# Patient Record
Sex: Female | Born: 1970 | Race: Black or African American | Hispanic: No | Marital: Married | Smoking: Former smoker
Health system: Southern US, Community
[De-identification: ages and names within clinical notes are randomized; demographics above are authoritative.]

## PROBLEM LIST (undated history)

## (undated) ENCOUNTER — Encounter

## (undated) ENCOUNTER — Ambulatory Visit

## (undated) ENCOUNTER — Telehealth

## (undated) ENCOUNTER — Encounter: Attending: Internal Medicine | Primary: Internal Medicine

## (undated) ENCOUNTER — Ambulatory Visit: Attending: Surgery | Primary: Surgery

## (undated) ENCOUNTER — Ambulatory Visit: Payer: PRIVATE HEALTH INSURANCE | Attending: Adult Health | Primary: Adult Health

## (undated) ENCOUNTER — Ambulatory Visit: Payer: PRIVATE HEALTH INSURANCE

## (undated) ENCOUNTER — Encounter: Attending: Surgery | Primary: Surgery

## (undated) ENCOUNTER — Telehealth: Attending: Surgery | Primary: Surgery

## (undated) ENCOUNTER — Ambulatory Visit: Payer: PRIVATE HEALTH INSURANCE | Attending: Internal Medicine | Primary: Internal Medicine

## (undated) DIAGNOSIS — Z1371 Encounter for nonprocreative screening for genetic disease carrier status: Secondary | ICD-10-CM

## (undated) DIAGNOSIS — C801 Malignant (primary) neoplasm, unspecified: Secondary | ICD-10-CM

## (undated) DIAGNOSIS — Z923 Personal history of irradiation: Secondary | ICD-10-CM

## (undated) DIAGNOSIS — D649 Anemia, unspecified: Secondary | ICD-10-CM

## (undated) DIAGNOSIS — C50919 Malignant neoplasm of unspecified site of unspecified female breast: Secondary | ICD-10-CM

## (undated) DIAGNOSIS — E559 Vitamin D deficiency, unspecified: Secondary | ICD-10-CM

## (undated) DIAGNOSIS — K219 Gastro-esophageal reflux disease without esophagitis: Secondary | ICD-10-CM

## (undated) HISTORY — DX: Vitamin D deficiency, unspecified: E55.9

## (undated) HISTORY — DX: Malignant (primary) neoplasm, unspecified: C80.1

## (undated) HISTORY — DX: Anemia, unspecified: D64.9

## (undated) HISTORY — DX: Gastro-esophageal reflux disease without esophagitis: K21.9

## (undated) HISTORY — PX: BREAST BIOPSY: SHX20

## (undated) HISTORY — DX: Encounter for nonprocreative screening for genetic disease carrier status: Z13.71

---
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
## 1998-03-08 ENCOUNTER — Other Ambulatory Visit: Admission: RE | Admit: 1998-03-08 | Discharge: 1998-03-08 | Payer: Self-pay | Admitting: Obstetrics and Gynecology

## 1999-03-11 ENCOUNTER — Other Ambulatory Visit: Admission: RE | Admit: 1999-03-11 | Discharge: 1999-03-11 | Payer: Self-pay | Admitting: Obstetrics and Gynecology

## 1999-10-04 ENCOUNTER — Encounter: Admission: RE | Admit: 1999-10-04 | Discharge: 1999-10-04 | Payer: Self-pay | Admitting: Obstetrics and Gynecology

## 1999-10-04 ENCOUNTER — Encounter: Payer: Self-pay | Admitting: Obstetrics and Gynecology

## 2000-07-20 ENCOUNTER — Other Ambulatory Visit: Admission: RE | Admit: 2000-07-20 | Discharge: 2000-07-20 | Payer: Self-pay | Admitting: Obstetrics and Gynecology

## 2001-05-14 ENCOUNTER — Encounter: Payer: Self-pay | Admitting: Obstetrics and Gynecology

## 2001-05-14 ENCOUNTER — Encounter: Admission: RE | Admit: 2001-05-14 | Discharge: 2001-05-14 | Payer: Self-pay | Admitting: Obstetrics and Gynecology

## 2001-07-26 ENCOUNTER — Other Ambulatory Visit: Admission: RE | Admit: 2001-07-26 | Discharge: 2001-07-26 | Payer: Self-pay | Admitting: Obstetrics and Gynecology

## 2002-08-29 ENCOUNTER — Other Ambulatory Visit: Admission: RE | Admit: 2002-08-29 | Discharge: 2002-08-29 | Payer: Self-pay | Admitting: Obstetrics and Gynecology

## 2003-09-28 ENCOUNTER — Other Ambulatory Visit: Admission: RE | Admit: 2003-09-28 | Discharge: 2003-09-28 | Payer: Self-pay | Admitting: Obstetrics and Gynecology

## 2004-06-05 HISTORY — PX: DILATION AND CURETTAGE OF UTERUS: SHX78

## 2004-07-21 ENCOUNTER — Emergency Department: Payer: Self-pay | Admitting: Emergency Medicine

## 2006-03-01 ENCOUNTER — Ambulatory Visit: Payer: Self-pay | Admitting: Family Medicine

## 2006-03-07 ENCOUNTER — Ambulatory Visit: Payer: Self-pay | Admitting: Obstetrics and Gynecology

## 2012-06-05 HISTORY — PX: HAND SURGERY: SHX662

## 2013-04-17 LAB — HM MAMMOGRAPHY

## 2013-04-30 ENCOUNTER — Ambulatory Visit: Payer: Self-pay | Admitting: Specialist

## 2013-05-07 ENCOUNTER — Ambulatory Visit: Payer: Self-pay | Admitting: Nurse Practitioner

## 2013-05-13 ENCOUNTER — Emergency Department: Payer: Self-pay | Admitting: Emergency Medicine

## 2013-05-14 ENCOUNTER — Ambulatory Visit: Payer: Self-pay | Admitting: Nurse Practitioner

## 2013-05-14 HISTORY — PX: BREAST SURGERY: SHX581

## 2013-06-05 DIAGNOSIS — C50919 Malignant neoplasm of unspecified site of unspecified female breast: Secondary | ICD-10-CM

## 2013-06-05 DIAGNOSIS — Z923 Personal history of irradiation: Secondary | ICD-10-CM

## 2013-06-05 DIAGNOSIS — C801 Malignant (primary) neoplasm, unspecified: Secondary | ICD-10-CM

## 2013-06-05 HISTORY — DX: Malignant (primary) neoplasm, unspecified: C80.1

## 2013-06-05 HISTORY — PX: BREAST BIOPSY: SHX20

## 2013-06-05 HISTORY — PX: BREAST LUMPECTOMY: SHX2

## 2013-06-05 HISTORY — DX: Malignant neoplasm of unspecified site of unspecified female breast: C50.919

## 2013-06-05 HISTORY — DX: Personal history of irradiation: Z92.3

## 2013-06-12 ENCOUNTER — Ambulatory Visit: Payer: BC Managed Care – PPO

## 2013-06-12 ENCOUNTER — Encounter: Payer: Self-pay | Admitting: General Surgery

## 2013-06-12 ENCOUNTER — Ambulatory Visit (INDEPENDENT_AMBULATORY_CARE_PROVIDER_SITE_OTHER): Payer: BC Managed Care – PPO | Admitting: General Surgery

## 2013-06-12 VITALS — BP 140/80 | HR 74 | Resp 12 | Ht 69.0 in | Wt 177.0 lb

## 2013-06-12 DIAGNOSIS — N6099 Unspecified benign mammary dysplasia of unspecified breast: Secondary | ICD-10-CM

## 2013-06-12 DIAGNOSIS — N63 Unspecified lump in unspecified breast: Secondary | ICD-10-CM

## 2013-06-12 DIAGNOSIS — N6089 Other benign mammary dysplasias of unspecified breast: Secondary | ICD-10-CM

## 2013-06-12 NOTE — Progress Notes (Signed)
Patient ID: Traci Clements, female   DOB: Jan 28, 1971, 43 y.o.   MRN: [id number]  Chief Complaint  Patient presents with  . Breast Problem    HPI Traci Clements is a 43 y.o. female.  who presents for a breast evaluation. The most recent mammogram was done on 05/07/13. She had a right breast biopsy done a week later which came back benign.  Patient does perform regular self breast checks and gets regular mammograms done.    The patient had noticed thickening in the medial aspect of the right breast, new from a prior mammogram on 04/09/2013. She underwent repeat imaging and subsequent biopsy.    HPI  No past medical history on file.  Past Surgical History  Procedure Laterality Date  . Cesarean section  1998  . Tonsillectomy  1990  . Hand surgery Left 2014  . Dilation and curettage of uterus  2006  . Breast surgery Right 05/14/2013    Stereo biopsy, [facility]    Family History  Problem Relation Age of Onset  . Breast cancer Maternal Grandmother     great     Social History History  Substance Use Topics  . Smoking status: Current Every Day Smoker -- 0.25 packs/day for 15 years  . Smokeless tobacco: Never Used  . Alcohol Use: Yes    Allergies  Allergen Reactions  . Codeine Nausea Only    Current Outpatient Prescriptions  Medication Sig Dispense Refill  . Norethin Ace-Eth Estrad-FE (LOESTRIN FE 1/20 PO) Take 1 tablet by mouth daily.       No current facility-administered medications for this visit.    Review of Systems Review of Systems  Blood pressure 140/80, pulse 74, resp. rate 12, height 5\' 9"  (1.753 m), weight 177 lb (80.287 kg), last menstrual period 03/12/2013.  Physical Exam Physical Exam  Constitutional: She is oriented to person, place, and time. She appears well-developed and well-nourished.  Eyes: Conjunctivae are normal. No scleral icterus.  Neck: Neck supple.  Cardiovascular: Normal rate, regular rhythm and normal heart sounds.    Pulmonary/Chest: Effort normal and breath sounds normal. Right breast exhibits no inverted nipple, no nipple discharge, no skin change and no tenderness. Left breast exhibits no inverted nipple, no mass, no nipple discharge, no skin change and no tenderness.  Lymphadenopathy:    She has no cervical adenopathy.    She has no axillary adenopathy.  Neurological: She is alert and oriented to person, place, and time.  Skin: Skin is warm and dry.    Data Reviewed Screening mammograms completed at [facility] 04/09/2013 as a baseline study showed benign-appearing calcifications bilaterally. Microcalcifications associated with an asymmetric tissue in the right medial breast was identified for which additional views were recommended. BI-RAD-0.  Right breast focal spot compression views and ultrasound dated 05/07/2013 showed a large area of asymmetry/mass effect in the medial breast. Punctate calcifications are identified. Physical examination was reported as negative.Marland Kitchen Ultrasound showed multiple round hypoechoic masses with surrounding hyperechoic tissue measuring 1.2 x 2.3 x 2.4 cm suggestive of multiple cysts.  Core biopsy was completed 05/14/2013 by the radiology service.  Ultrasound biopsy report dated 05/14/2013 reported to, 1 mm intraductal papillomas, one of which showed sclerosis. Atypical ductal hyperplasia in one foci, atypical lobular hyperplasia and at least 2 foci, columnar cell changes and a few scattered microcyst. The pathologic findings were not felt to correlate with report of a mass lesion.  Ultrasound examination of the right breast in the 2:00 position, 4 cm from  the nipple showed a 1.1 x 1.2 x 1.36 cm heterogeneous mass with acoustic shadowing consistent with her recent biopsy site.  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    Plan    Indication for formal excision of the area of mammographic/ultrasound abnormality to help determine if  additional pathologic processes are present (upstaging to DCIS/invasive cancer). The patient was amenable to proceed. This has been scheduled for 07/03/2013 as an outpatient at [facility].       Traci Clements 06/13/2013, 2:40 PM

## 2013-06-12 NOTE — Patient Instructions (Signed)
Patient to have a right breast biopsy at [facility].

## 2013-06-13 ENCOUNTER — Other Ambulatory Visit: Payer: Self-pay | Admitting: General Surgery

## 2013-06-13 ENCOUNTER — Encounter: Payer: Self-pay | Admitting: General Surgery

## 2013-06-13 DIAGNOSIS — N6099 Unspecified benign mammary dysplasia of unspecified breast: Secondary | ICD-10-CM | POA: Insufficient documentation

## 2013-07-03 ENCOUNTER — Ambulatory Visit: Payer: Self-pay | Admitting: General Surgery

## 2013-07-03 DIAGNOSIS — D0590 Unspecified type of carcinoma in situ of unspecified breast: Secondary | ICD-10-CM

## 2013-07-03 DIAGNOSIS — D0510 Intraductal carcinoma in situ of unspecified breast: Secondary | ICD-10-CM | POA: Insufficient documentation

## 2013-07-03 HISTORY — PX: BREAST SURGERY: SHX581

## 2013-07-04 ENCOUNTER — Encounter: Payer: Self-pay | Admitting: General Surgery

## 2013-07-07 ENCOUNTER — Encounter: Payer: Self-pay | Admitting: General Surgery

## 2013-07-07 ENCOUNTER — Telehealth: Payer: Self-pay | Admitting: General Surgery

## 2013-07-07 NOTE — Telephone Encounter (Signed)
Appointment time has been changed accordingly.   Traci Clements- make sure we have hard copy of pathology report. Also, chart will need to be moved in your stack according to appointment time. Ask if questions. Thanks.

## 2013-07-07 NOTE — Telephone Encounter (Signed)
The patient was notified that her breast biopsy completed on 07/03/2013 did show some very early cancer, DCIS/LCIS. Margins were negative.  We'll plan to get together on Thursday, February 5 at 4:30 PM to review options for additional treatment.

## 2013-07-09 ENCOUNTER — Encounter: Payer: Self-pay | Admitting: General Surgery

## 2013-07-09 LAB — PATHOLOGY REPORT

## 2013-07-10 ENCOUNTER — Ambulatory Visit (INDEPENDENT_AMBULATORY_CARE_PROVIDER_SITE_OTHER): Payer: BC Managed Care – PPO | Admitting: General Surgery

## 2013-07-10 ENCOUNTER — Encounter: Payer: Self-pay | Admitting: General Surgery

## 2013-07-10 VITALS — BP 134/84 | HR 76 | Resp 12 | Ht 69.0 in | Wt 176.0 lb

## 2013-07-10 DIAGNOSIS — D0590 Unspecified type of carcinoma in situ of unspecified breast: Secondary | ICD-10-CM

## 2013-07-10 DIAGNOSIS — D0510 Intraductal carcinoma in situ of unspecified breast: Secondary | ICD-10-CM

## 2013-07-10 NOTE — Patient Instructions (Signed)
Continue self breast exams. Call office for any new breast issues or concerns. 

## 2013-07-10 NOTE — Progress Notes (Signed)
Patient ID: Traci Clements, female   DOB: [birth year], 43 y.o.   MRN: [id number]  Chief Complaint  Patient presents with  . Routine Post Op    right breast biopsy    HPI Traci Clements is a 43 y.o. female here today for her post op right breast wide excision done 07/03/13. Patient states she is doing well. Denies pain only a little soreness. HPI  No past medical history on file.  Past Surgical History  Procedure Laterality Date  . Cesarean section  1998  . Tonsillectomy  1990  . Hand surgery Left 2014  . Dilation and curettage of uterus  2006  . Breast surgery Right 05/14/2013    Stereo biopsy, [facility]  . Breast surgery Right 07-03-13    DCIS    Family History  Problem Relation Age of Onset  . Breast cancer Maternal Grandmother     great     Social History History  Substance Use Topics  . Smoking status: Current Every Day Smoker -- 0.25 packs/day for 15 years  . Smokeless tobacco: Never Used  . Alcohol Use: Yes    Allergies  Allergen Reactions  . Codeine Nausea Only    Current Outpatient Prescriptions  Medication Sig Dispense Refill  . Norethin Ace-Eth Estrad-FE (LOESTRIN FE 1/20 PO) Take 1 tablet by mouth daily.       No current facility-administered medications for this visit.    Review of Systems Review of Systems  Constitutional: Negative.   Respiratory: Negative.   Cardiovascular: Negative.     Blood pressure 134/84, pulse 76, resp. rate 12, height 5\' 9"  (1.753 m), weight 176 lb (79.833 kg), last menstrual period 06/30/2013.  Physical Exam Physical Exam  Constitutional: She is oriented to person, place, and time. She appears well-developed and well-nourished.  Neurological: She is alert and oriented to person, place, and time.  Skin: Skin is warm and dry.  The wide excision site at the upper inner quadrant of the right breast is healing well without evidence of hematoma formation. Mild local skin edema inferior to the incision secondary to  reconstruction techniques.  Data Reviewed Right breast wide excision completed July 03, 2013 showed DCIS, LCIS, fibrocystic changes, duct ectasia, columnar cell changes with hyperplasia, intraductal papilloma, changes related to the previous biopsy site. pTis. ER/PR 90%.  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     Plan    The patient was accompanied today by her husband, daughter and mother.  She is encouraged to consider genetic testing due to her young age and diagnosis.  Indication for post wide excision radiation therapy was reviewed.  Indication for post treatment antiestrogen therapy was discussed. An opportunity will be made for the patient to be evaluated for medical oncology assessment for any possible treatment protocols, and if not antiestrogen therapy will be prescribed to this office.  The need to look for and on hormonal methods of birth control was discussed, and this will take place after evaluation by radiation and medical oncology.          Robert Bellow 07/10/2013, 8:30 PM

## 2013-07-10 NOTE — Addendum Note (Signed)
Addended by: Robert Bellow on: 07/10/2013 08:34 PM   Modules accepted: Level of Service

## 2013-07-14 ENCOUNTER — Encounter: Payer: Self-pay | Admitting: General Surgery

## 2013-07-15 ENCOUNTER — Ambulatory Visit: Payer: Self-pay | Admitting: Oncology

## 2013-07-15 DIAGNOSIS — Z1371 Encounter for nonprocreative screening for genetic disease carrier status: Secondary | ICD-10-CM

## 2013-07-15 HISTORY — DX: Encounter for nonprocreative screening for genetic disease carrier status: Z13.71

## 2013-08-03 ENCOUNTER — Ambulatory Visit: Payer: Self-pay | Admitting: Oncology

## 2013-08-07 LAB — CBC CANCER CENTER
BASOS ABS: 0 x10 3/mm (ref 0.0–0.1)
BASOS PCT: 0.4 %
EOS PCT: 3.8 %
Eosinophil #: 0.2 x10 3/mm (ref 0.0–0.7)
HCT: 37.5 % (ref 35.0–47.0)
HGB: 12.7 g/dL (ref 12.0–16.0)
LYMPHS ABS: 1.9 x10 3/mm (ref 1.0–3.6)
Lymphocyte %: 35.4 %
MCH: 33.1 pg (ref 26.0–34.0)
MCHC: 33.8 g/dL (ref 32.0–36.0)
MCV: 98 fL (ref 80–100)
MONO ABS: 0.5 x10 3/mm (ref 0.2–0.9)
MONOS PCT: 8.8 %
Neutrophil #: 2.8 x10 3/mm (ref 1.4–6.5)
Neutrophil %: 51.6 %
Platelet: 275 x10 3/mm (ref 150–440)
RBC: 3.83 10*6/uL (ref 3.80–5.20)
RDW: 14.8 % — ABNORMAL HIGH (ref 11.5–14.5)
WBC: 5.5 x10 3/mm (ref 3.6–11.0)

## 2013-08-11 ENCOUNTER — Ambulatory Visit: Payer: BC Managed Care – PPO | Admitting: General Surgery

## 2013-08-13 ENCOUNTER — Ambulatory Visit (INDEPENDENT_AMBULATORY_CARE_PROVIDER_SITE_OTHER): Payer: BC Managed Care – PPO | Admitting: General Surgery

## 2013-08-13 ENCOUNTER — Encounter: Payer: Self-pay | Admitting: General Surgery

## 2013-08-13 VITALS — BP 124/80 | HR 78 | Resp 12 | Ht 68.5 in | Wt 176.0 lb

## 2013-08-13 DIAGNOSIS — D0590 Unspecified type of carcinoma in situ of unspecified breast: Secondary | ICD-10-CM

## 2013-08-13 DIAGNOSIS — D0510 Intraductal carcinoma in situ of unspecified breast: Secondary | ICD-10-CM

## 2013-08-13 NOTE — Patient Instructions (Signed)
Continue self breast exams. Call office for any new breast issues or concerns. 

## 2013-08-13 NOTE — Progress Notes (Signed)
APatient ID: Traci Clements, female   DOB: 05/19/1971, 43 y.o.   MRN: [id number]  Chief Complaint  Patient presents with  . Follow-up    HPI Traci Clements is a 43 y.o. female.  Here today for follow up right breast DCIS. Currently under the care of Dr. Baruch Gouty at [facility] and undergoing radiation. Right breast is a little tender occasionally and she just had radiation this morning. BRCA negative. ( Comprehensive genetic testing was completed to the cancer center).  HPI  Past Medical History  Diagnosis Date  . 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 Cesarean section  1998  . Tonsillectomy  1990  . Hand surgery Left 2014  . Dilation and curettage of uterus  2006  . Breast surgery Right 05/14/2013    Stereo biopsy, [facility]  . Breast surgery Right 07-03-13    DCIS    Family History  Problem Relation Age of Onset  . Breast cancer Maternal Grandmother     great     Social History History  Substance Use Topics  . Smoking status: Current Every Day Smoker -- 0.25 packs/day for 15 years  . Smokeless tobacco: Never Used  . Alcohol Use: Yes    Allergies  Allergen Reactions  . Codeine Nausea Only    Current Outpatient Prescriptions  Medication Sig Dispense Refill  . Multiple Vitamin (MULTIVITAMIN) capsule Take 1 capsule by mouth daily.      . Norethin Ace-Eth Estrad-FE (LOESTRIN FE 1/20 PO) Take 1 tablet by mouth daily.       No current facility-administered medications for this visit.    Review of Systems Review of Systems  Constitutional: Negative.   Respiratory: Negative.   Cardiovascular: Negative.     Blood pressure 124/80, pulse 78, resp. rate 12, height 5' 8.5" (1.74 m), weight 176 lb (79.833 kg), last menstrual period 08/03/2013.  Physical Exam Physical Exam  Constitutional: She is oriented to person, place, and time. She appears well-developed and well-nourished.  Neck:  Neck supple.  Pulmonary/Chest:  Well healed scar right breast with slight thickening  Lymphadenopathy:    She has no cervical adenopathy.  Neurological: She is alert and oriented to person, place, and time.  Skin: Skin is warm and dry.       Assessment    Doing well status post wide excision and tolerating radiation therapy well.     Plan    The patient had been seen by medical oncology. In light of the findings of DCIS in the negative genetic screening she will only be a candidate for tamoxifen radiation is completed.  The patient was advised that she can continue to see both the radiation oncologist, the medical oncologist and myself or that the tamoxifen can be provided to this office.  Side effects from tamoxifen were reviewed including those related to vasomotor symptoms and DVT/PE.   The patient is presently making use of oral contraceptives. Alternate forms of contraception will be to be discussed prior to initiating antiestrogen therapy.  Arrangements will made for a right breast diagnostic mammogram in July 2015 at [place].       Robert Bellow 08/16/2013, 8:34 PM

## 2013-08-14 ENCOUNTER — Encounter: Payer: Self-pay | Admitting: General Surgery

## 2013-08-14 LAB — CBC CANCER CENTER
BASOS ABS: 0.1 x10 3/mm (ref 0.0–0.1)
BASOS PCT: 1.3 %
EOS ABS: 0.3 x10 3/mm (ref 0.0–0.7)
EOS PCT: 4.4 %
HCT: 38.7 % (ref 35.0–47.0)
HGB: 13 g/dL (ref 12.0–16.0)
LYMPHS ABS: 1.9 x10 3/mm (ref 1.0–3.6)
Lymphocyte %: 31 %
MCH: 33 pg (ref 26.0–34.0)
MCHC: 33.5 g/dL (ref 32.0–36.0)
MCV: 98 fL (ref 80–100)
Monocyte #: 0.5 x10 3/mm (ref 0.2–0.9)
Monocyte %: 8 %
NEUTROS PCT: 55.3 %
Neutrophil #: 3.3 x10 3/mm (ref 1.4–6.5)
PLATELETS: 282 x10 3/mm (ref 150–440)
RBC: 3.93 10*6/uL (ref 3.80–5.20)
RDW: 14.1 % (ref 11.5–14.5)
WBC: 6 x10 3/mm (ref 3.6–11.0)

## 2013-08-21 LAB — CBC CANCER CENTER
Basophil #: 0.1 x10 3/mm (ref 0.0–0.1)
Basophil %: 1.2 %
EOS ABS: 0.2 x10 3/mm (ref 0.0–0.7)
Eosinophil %: 4 %
HCT: 38.8 % (ref 35.0–47.0)
HGB: 12.8 g/dL (ref 12.0–16.0)
Lymphocyte #: 1.7 x10 3/mm (ref 1.0–3.6)
Lymphocyte %: 27.6 %
MCH: 32.5 pg (ref 26.0–34.0)
MCHC: 33 g/dL (ref 32.0–36.0)
MCV: 99 fL (ref 80–100)
MONOS PCT: 8 %
Monocyte #: 0.5 x10 3/mm (ref 0.2–0.9)
NEUTROS ABS: 3.7 x10 3/mm (ref 1.4–6.5)
Neutrophil %: 59.2 %
Platelet: 287 x10 3/mm (ref 150–440)
RBC: 3.93 10*6/uL (ref 3.80–5.20)
RDW: 14.2 % (ref 11.5–14.5)
WBC: 6.3 x10 3/mm (ref 3.6–11.0)

## 2013-08-28 LAB — CBC CANCER CENTER
BASOS ABS: 0.1 x10 3/mm (ref 0.0–0.1)
Basophil %: 1.3 %
EOS ABS: 0.3 x10 3/mm (ref 0.0–0.7)
EOS PCT: 3.9 %
HCT: 37.5 % (ref 35.0–47.0)
HGB: 12.4 g/dL (ref 12.0–16.0)
LYMPHS ABS: 1.6 x10 3/mm (ref 1.0–3.6)
Lymphocyte %: 21.3 %
MCH: 32.6 pg (ref 26.0–34.0)
MCHC: 33.1 g/dL (ref 32.0–36.0)
MCV: 98 fL (ref 80–100)
MONOS PCT: 6.7 %
Monocyte #: 0.5 x10 3/mm (ref 0.2–0.9)
NEUTROS ABS: 5 x10 3/mm (ref 1.4–6.5)
Neutrophil %: 66.8 %
Platelet: 257 x10 3/mm (ref 150–440)
RBC: 3.81 10*6/uL (ref 3.80–5.20)
RDW: 14.3 % (ref 11.5–14.5)
WBC: 7.4 x10 3/mm (ref 3.6–11.0)

## 2013-09-03 ENCOUNTER — Ambulatory Visit: Payer: Self-pay | Admitting: Oncology

## 2013-09-04 LAB — CBC CANCER CENTER
Basophil #: 0.1 x10 3/mm (ref 0.0–0.1)
Basophil %: 1.1 %
EOS ABS: 0.3 x10 3/mm (ref 0.0–0.7)
EOS PCT: 4.9 %
HCT: 38.4 % (ref 35.0–47.0)
HGB: 12.8 g/dL (ref 12.0–16.0)
LYMPHS ABS: 1.6 x10 3/mm (ref 1.0–3.6)
Lymphocyte %: 24.6 %
MCH: 32.9 pg (ref 26.0–34.0)
MCHC: 33.4 g/dL (ref 32.0–36.0)
MCV: 99 fL (ref 80–100)
Monocyte #: 0.5 x10 3/mm (ref 0.2–0.9)
Monocyte %: 8.6 %
Neutrophil #: 3.8 x10 3/mm (ref 1.4–6.5)
Neutrophil %: 60.8 %
PLATELETS: 267 x10 3/mm (ref 150–440)
RBC: 3.89 10*6/uL (ref 3.80–5.20)
RDW: 14.3 % (ref 11.5–14.5)
WBC: 6.3 x10 3/mm (ref 3.6–11.0)

## 2013-09-11 LAB — CBC CANCER CENTER
BASOS ABS: 0.1 x10 3/mm (ref 0.0–0.1)
BASOS PCT: 1.2 %
EOS ABS: 0.3 x10 3/mm (ref 0.0–0.7)
EOS PCT: 6.4 %
HCT: 37.5 % (ref 35.0–47.0)
HGB: 12.4 g/dL (ref 12.0–16.0)
LYMPHS ABS: 1.4 x10 3/mm (ref 1.0–3.6)
Lymphocyte %: 27.8 %
MCH: 32.5 pg (ref 26.0–34.0)
MCHC: 33.1 g/dL (ref 32.0–36.0)
MCV: 98 fL (ref 80–100)
MONOS PCT: 10 %
Monocyte #: 0.5 x10 3/mm (ref 0.2–0.9)
Neutrophil #: 2.8 x10 3/mm (ref 1.4–6.5)
Neutrophil %: 54.6 %
Platelet: 258 x10 3/mm (ref 150–440)
RBC: 3.82 10*6/uL (ref 3.80–5.20)
RDW: 14.2 % (ref 11.5–14.5)
WBC: 5.1 x10 3/mm (ref 3.6–11.0)

## 2013-09-24 ENCOUNTER — Encounter: Payer: Self-pay | Admitting: General Surgery

## 2013-09-24 ENCOUNTER — Ambulatory Visit (INDEPENDENT_AMBULATORY_CARE_PROVIDER_SITE_OTHER): Payer: BC Managed Care – PPO | Admitting: General Surgery

## 2013-09-24 VITALS — BP 120/88 | HR 76 | Resp 14 | Ht 68.5 in | Wt 176.0 lb

## 2013-09-24 DIAGNOSIS — D0500 Lobular carcinoma in situ of unspecified breast: Secondary | ICD-10-CM

## 2013-09-24 DIAGNOSIS — N6099 Unspecified benign mammary dysplasia of unspecified breast: Secondary | ICD-10-CM

## 2013-09-24 DIAGNOSIS — D0510 Intraductal carcinoma in situ of unspecified breast: Secondary | ICD-10-CM

## 2013-09-24 DIAGNOSIS — D0590 Unspecified type of carcinoma in situ of unspecified breast: Secondary | ICD-10-CM

## 2013-09-24 DIAGNOSIS — N6089 Other benign mammary dysplasias of unspecified breast: Secondary | ICD-10-CM

## 2013-09-24 DIAGNOSIS — N62 Hypertrophy of breast: Secondary | ICD-10-CM

## 2013-09-24 MED ORDER — TAMOXIFEN CITRATE 20 MG PO TABS: 20.0000 mg | ORAL_TABLET | Freq: Every day | ORAL | Status: DC

## 2013-09-24 NOTE — Progress Notes (Signed)
Patient ID: Traci Clements, female   DOB: 04-Apr-1971, 43 y.o.   MRN: [id number]  Chief Complaint  Patient presents with  . Follow-up    6 week follow up dcis no mammogram    HPI Traci Clements is a 43 y.o. female who presents for a 6 week follow up of DCIS. She underwent a right breast wide excision on 07/03/13. She denies any new problems at this time. She completed her radiation treatments within the last 2 weeks. Most prominent of the skin changes with desquamation have resolved.  HPI  Past Medical History  Diagnosis Date  . 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 Cesarean section  1998  . Tonsillectomy  1990  . Hand surgery Left 2014  . Dilation and curettage of uterus  2006  . Breast surgery Right 05/14/2013    Stereo biopsy, [facility]  . Breast surgery Right 07-03-13    DCIS    Family History  Problem Relation Age of Onset  . Breast cancer Maternal Grandmother     great     Social History History  Substance Use Topics  . Smoking status: Current Every Day Smoker -- 0.25 packs/day for 15 years  . Smokeless tobacco: Never Used  . Alcohol Use: Yes    Allergies  Allergen Reactions  . Codeine Nausea Only    Current Outpatient Prescriptions  Medication Sig Dispense Refill  . Multiple Vitamin (MULTIVITAMIN) capsule Take 1 capsule by mouth daily.      . tamoxifen (NOLVADEX) 20 MG tablet Take 1 tablet (20 mg total) by mouth daily.  30 tablet  11   No current facility-administered medications for this visit.    Review of Systems Review of Systems  Constitutional: Negative.   Respiratory: Negative.   Cardiovascular: Negative.     Blood pressure 120/88, pulse 76, resp. rate 14, height 5' 8.5" (1.74 m), weight 176 lb (79.833 kg), last menstrual period 09/24/2013.  Physical Exam Physical Exam  Constitutional: She is oriented to person,  place, and time. She appears well-developed and well-nourished.  Neck: Neck supple. No thyromegaly present.  Pulmonary/Chest: Right breast exhibits no inverted nipple, no mass, no nipple discharge, no skin change and no tenderness.  Skin changes on the right breast due to radiation treatments.   Lymphadenopathy:    She has no cervical adenopathy.    She has no axillary adenopathy.  Neurological: She is alert and oriented to person, place, and time.  Skin: Skin is warm and dry.      Assessment    DCIS right breast.     Plan    Indication for antiestrogen therapy was reviewed. The patient is amenable to a trial of tamoxifen, 20 mg daily. The potential for DVT was reviewed. Vasomotor symptoms were discussed. She is actively menstruating so the risk of endometrial cancer is exceptionally low. The importance of making use of the medication for one month trial was emphasized.  She will call in one month with a progress report.  Will arrange for a repeat right breast mammogram in 5 months to establish a new baseline.     PCP: No Pcp Per Patient Ref MD: Traci Ferrari NP   Traci Clements 09/25/2013, 8:14 PM

## 2013-09-24 NOTE — Patient Instructions (Signed)
Patient to return in 5 months with right diagnostic mammogram. Patient advised to call in 1 month for an update on how Tamoxifen is doing. Continue self breast exams. Call office for any new breast issues or concerns.

## 2013-09-25 ENCOUNTER — Encounter: Payer: Self-pay | Admitting: General Surgery

## 2013-09-25 DIAGNOSIS — N6099 Unspecified benign mammary dysplasia of unspecified breast: Secondary | ICD-10-CM | POA: Insufficient documentation

## 2013-10-03 ENCOUNTER — Ambulatory Visit: Payer: Self-pay | Admitting: Oncology

## 2013-10-31 ENCOUNTER — Telehealth: Payer: Self-pay | Admitting: General Surgery

## 2013-10-31 NOTE — Telephone Encounter (Signed)
PATIENT CALLED TODAY TO REPORT HOW SHE HAS DONE OVER THE LAST MONTH TAKING TAMOXIFEN.SHE STATES SHE IS DOING WELL AND NOT EXPERIENCE SIDE EFFECTS.

## 2013-11-07 ENCOUNTER — Emergency Department: Payer: Self-pay | Admitting: Emergency Medicine

## 2013-11-07 LAB — COMPREHENSIVE METABOLIC PANEL
ALT: 10 U/L — AB (ref 12–78)
Albumin: 3.4 g/dL (ref 3.4–5.0)
Alkaline Phosphatase: 54 U/L
Anion Gap: 1 — ABNORMAL LOW (ref 7–16)
BUN: 11 mg/dL (ref 7–18)
Bilirubin,Total: 0.2 mg/dL (ref 0.2–1.0)
CHLORIDE: 113 mmol/L — AB (ref 98–107)
CO2: 27 mmol/L (ref 21–32)
CREATININE: 0.92 mg/dL (ref 0.60–1.30)
Calcium, Total: 8.5 mg/dL (ref 8.5–10.1)
Glucose: 85 mg/dL (ref 65–99)
Osmolality: 280 (ref 275–301)
Potassium: 3.8 mmol/L (ref 3.5–5.1)
SGOT(AST): 13 U/L — ABNORMAL LOW (ref 15–37)
SODIUM: 141 mmol/L (ref 136–145)
Total Protein: 6.8 g/dL (ref 6.4–8.2)

## 2013-11-07 LAB — TROPONIN I: Troponin-I: <0.02 ng/mL

## 2013-11-07 LAB — URINALYSIS, COMPLETE
BILIRUBIN, UR: NEGATIVE
Bacteria: NONE SEEN
GLUCOSE, UR: NEGATIVE mg/dL (ref 0–75)
KETONE: NEGATIVE
Leukocyte Esterase: NEGATIVE
NITRITE: NEGATIVE
PH: 6 (ref 4.5–8.0)
PROTEIN: NEGATIVE
RBC,UR: 27 /HPF (ref 0–5)
Specific Gravity: 1.032 (ref 1.003–1.030)
Squamous Epithelial: 2

## 2013-11-07 LAB — CBC
HCT: 37.7 % (ref 35.0–47.0)
HGB: 12.7 g/dL (ref 12.0–16.0)
MCH: 33.4 pg (ref 26.0–34.0)
MCHC: 33.7 g/dL (ref 32.0–36.0)
MCV: 99 fL (ref 80–100)
Platelet: 259 10*3/uL (ref 150–440)
RBC: 3.8 10*6/uL (ref 3.80–5.20)
RDW: 14.1 % (ref 11.5–14.5)
WBC: 6.2 10*3/uL (ref 3.6–11.0)

## 2013-11-07 LAB — LIPASE, BLOOD: Lipase: 267 U/L (ref 73–393)

## 2014-02-25 ENCOUNTER — Ambulatory Visit: Payer: BC Managed Care – PPO | Admitting: General Surgery

## 2014-03-11 ENCOUNTER — Encounter: Payer: Self-pay | Admitting: *Deleted

## 2014-03-19 ENCOUNTER — Ambulatory Visit: Payer: Self-pay | Admitting: Oncology

## 2014-04-05 ENCOUNTER — Ambulatory Visit: Payer: Self-pay | Admitting: Oncology

## 2014-04-06 ENCOUNTER — Encounter: Payer: Self-pay | Admitting: General Surgery

## 2014-09-25 NOTE — Op Note (Signed)
PATIENT NAME:  AROHI, SALVATIERRA MR#:  [id number] DATE OF BIRTH:  1971/05/09  DATE OF PROCEDURE:  04/30/2013  PREOPERATIVE DIAGNOSIS: Dorsal left wrist ganglion.   POSTOPERATIVE DIAGNOSIS: Dorsal left wrist ganglion.   PROCEDURE: Excision of dorsal left wrist ganglion.   SURGEON: Lucas Mallow, MD  ANESTHESIA: General.   Complications: None  PROCEDURE: General anesthesia is induced. The left upper extremity is thoroughly prepped with alcohol and ChloraPrep and draped in standard sterile fashion. The extremity is wrapped out with the Esmarch bandage and pneumatic tourniquet elevated to 250 mmHg. Under loupe magnification, transverse incision is made over the prominence of the ganglion and the dissection carefully carried down, and then the extensor retinaculum is incised longitudinally. The ganglion is revealed, and the tendons are reflected to each side. The complete ganglion sac is identified and dissected out and is dissected down to the base of the second metacarpocarpal joint, where it appears to be originating. Dorsal capsule is removed along with the entire ganglion sac. The rongeur is used to remove a prominent carpometacarpal boss in this area. The rongeur is also used to remove any remnants of ganglion sac. The wound is thoroughly irrigated multiple times. Skin edges are infiltrated with 0.5% plain Marcaine. Retinaculum is closed loosely with 5-0 Vicryl. The subcutaneous tissue is closed with a running subcuticular 3-0 Prolene. A soft bulky dressing is applied along with a volar splint. The tourniquet is released, and the patient is returned to the recovery room in satisfactory condition, having tolerated the procedure quite well.   ____________________________ Lucas Mallow, MD ces:lb D: 04/30/2013 08:59:59 ET T: 04/30/2013 09:06:53 ET JOB#: [id number]  cc: Lucas Mallow, MD, <Dictator> Lucas Mallow MD ELECTRONICALLY SIGNED 05/08/2013 13:30

## 2014-09-26 NOTE — Consult Note (Signed)
Reason for Visit: This 44 year old Female patient presents to the clinic for initial evaluation of  breast cancer .   Referred by Dr. Hervey Ard.  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 This was confirmed on ultrasound to be a 2.3 cm region of mixed echogenicity likely a cluster of cysts. Patient underwent initial biopsy showing 2 intraductal papillomas. There was also atypical hyperplasia atypical llobular hyperplasia. Surgeon decided to perform additional sampling in this area and a 0.3 cm region of ductal carcinoma in situ grade 2 was noted. 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 Margins were clear 0.4 cm. Tumor was strongly ER/PR positive. Patient has done well postoperatively. She has been seen by medical oncology and recommendation for tamoxifen therapy after completion of radiation was made. She seen today for consideration of radiation. She is doing well. She specifically denies breast tenderness cough or bone pain. She is having no postop complications.  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   Tobacco Use:    Migraines:    Anemia:    Right Breast Bx: 2014   left wrist ganglion cyst removed:    Dilation and Curretage:    Tonsillectomy:    csection:   Past, Family and Social History:  Past Medical History positive   Neurological/Psychiatric migraine   Past Surgical History excision of left wrist ganglia, C-section,  tonsillectomy, D&C   Past Medical History Comments anemia   Family History positive   Family History Comments maternal aunts with breast cancer   Social History positive   Social History Comments 20-pack-year smoking history social EtOH use history   Additional Past Medical and Surgical History seen today accompanied by a nurse navigator   Allergies:   Codeine: GI Distress  Home Meds:  Home Medications: Medication Instructions Status  acetaminophen-HYDROcodone 325 mg-5 mg oral tablet 1 tab(s) orally every 4 to 6 hours, As needed, pain Active  Loestrin Fe 1/20 20 mcg-1 mg oral tablet 1 tab(s) orally once a day Active  multivitamin 1 tab(s) orally once a day Active  Tylenol 325 mg oral tablet 2 tab(s) orally every 4 hours, As Needed - for Pain Active  Aleve sodium 220 mg oral tablet 2 tab(s) orally 2 times a day for five days to control soreness, then as needed.  Active   Review of Systems:  General negative   Performance Status (ECOG) 0   Skin negative   Breast see HPI   Ophthalmologic negative   ENMT negative   Respiratory and Thorax negative   Cardiovascular negative   Gastrointestinal negative   Genitourinary negative   Musculoskeletal negative   Neurological negative   Psychiatric negative   Hematology/Lymphatics negative   Endocrine negative   Allergic/Immunologic negative   Review of Systems   review of systems obtained from nurse's notes  Nursing Notes:  Nursing Vital Signs and Chemo Nursing Nursing Notes: *CC Vital Signs Flowsheet:   10-Feb-15 10:39  Temp Temperature 96.6  Pulse Pulse 86  Respirations Respirations 18  SBP SBP 140  DBP DBP 100  Pain Scale (0-10)  0  Current Weight (kg) (kg) 81.1  Height (cm) centimeters 175  BSA (m2) 1.9   Physical Exam:  General/Skin/HEENT:  General normal   Skin normal   Eyes normal   ENMT normal   Head and Neck normal   Additional PE well-developed well-nourished female in NAD. She is  a wide local excision of the right breast which is healing well. No dominant mass or nodularity is noted in either breast into position examined. No axillary or supraclavicular adenopathy is identified. Lungs are clear to A&P cardiac examination shows regular rate and rhythm.   Breasts/Resp/CV/GI/GU:  Respiratory and Thorax normal   Cardiovascular normal   Gastrointestinal normal   Genitourinary normal   MS/Neuro/Psych/Lymph:  Musculoskeletal normal   Neurological normal   Lymphatics normal   Other Results:  Radiology Results: LabUnknown:    03-Dec-14 15:30, Digital Additional Views Rt Breast (SCR)  PACS Image     03-Dec-14 16:00, MAM Ultrasound  Right  PACS [place]:    03-Dec-14 15:30, Digital Additional Views Rt Breast (SCR)  Digital Additional Views Rt Breast (SCR)   REASON FOR EXAM:    av rt calcs and density OUSIDE  COMMENTS:       PROCEDURE: MAM - MAM DIG ADDVIEWS RT SCR  - May 07 2013  3:30PM     CLINICAL DATA:  Screening recall for a possible right breast mass.    EXAM:  DIGITAL DIAGNOSTIC  RIGHT MAMMOGRAM    ULTRASOUND RIGHT BREAST    COMPARISON:  Screening mammogram from 04/09/2013.    ACR Breast Density Category b: There are scattered areas of  fibroglandular density.    FINDINGS:  There is a large asymmetry/mass in the slightly medial right breast  confirmed on the additional CC and MLO spot compression views which  contains punctate calcifications and measures approximately 3.2 x  3.6 cm.    Physical examination of the medial right breast does not reveal any  palpable masses.    Targeted ultrasound of the right breast was performed demonstrating  a mixed echogenicity mass (multiple rounded hypoechoic masses with  surrounding hyperechoic tissue) at 2 o'clock 3 cm from the nipple  measuring approximately 2.3 x 1.2 x 2.4 cm, likely a cluster of  cysts. This corresponds with mammography findings.     IMPRESSION:  Probably benign  right breast mass, likely a cluster of microcysts.    RECOMMENDATION:  Management options were discussed with the patient, including  six-month followup ultrasound as well as ultrasound-guided biopsy.  The patient elects biopsy. This is scheduled for Wednesday December  10th at 9 a.m..    I have discussed the findings and recommendations with the patient.  Results were also provided in writing at the conclusion of the  visit. If applicable, a reminder letter will be sent to the patient  regarding the next appointment.  BI-RADS CATEGORY  3: Probably benign finding(s) - short interval  follow-up suggested.      Electronically Signed    By: Everlean Alstrom M.D.    On: 05/07/2013 16:11         Verified By: Trudee Kuster, M.D.,   Assessment and Plan: Impression:   small focus of ductal carcinoma in situ of the right breast in the region of significant breast pathology including atypical ductal hyperplasia and sclerosing adenosis in a young 44 year old female Plan:   at  this time I got over treatment recommendations with the patient including accelerated partial breast irradiation versus whole breast radiation. Risks and benefits of each procedure was discussed. Based on her extremely young age other breast pathology feels she may benefit slightly more from whole breast radiation. Patient decided to pursue whole breast radiation and I would plan on delivering 5000 cGy over 5 weeks boosting or scar another 1400 cGy using electron beam. Risks and benefits of treatment were reviewed with the patient and she seems to comprehend my treatment plan well. We'll also discussed the use of tamoxifen after completion of radiation therapy. I set the patient up for CT simulation early next week. Risks of treatment with a skin reaction, fatigue, inclusion of some superficial lung and possible alteration of blood counts all were explained in detail to the patient. She seems to comprehend my treatment  plan well.  I would like to take this opportunity to thank you for allowing me to continue to participate in this patient's care.  CC Referral:  cc: Dr. Hervey Ard   Electronic Signatures: Baruch Gouty, Roda Shutters (MD)  (Signed 10-Feb-15 14:34)  Authored: HPI, Diagnosis, Past Hx, PFSH, Allergies, Home Meds, ROS, Nursing Notes, Physical Exam, Other Results, Encounter Assessment and Plan, CC Referring Physician   Last Updated: 10-Feb-15 14:34 by Armstead Peaks (MD)

## 2014-09-26 NOTE — Op Note (Signed)
PATIENT NAME:  Traci Clements, Traci Clements MR#:  [id number] DATE OF BIRTH:  1970/06/24  DATE OF PROCEDURE:  07/03/2013  PREOPERATIVE DIAGNOSIS:  Atypical hyperplasia, atypical lobular hyperplasia of the right breast.   POSTOPERATIVE DIAGNOSIS:  Atypical hyperplasia, atypical lobular hyperplasia of the right breast.    OPERATIVE PROCEDURE:  Wide local excision, right breast.   OPERATING SURGEON:  Robert Bellow, MD  ANESTHESIA:  General by LMA under Dr. [place] Service, Marcaine 0.5% with 1:200,000 units of epinephrine 30 mL local infiltration.   ESTIMATED BLOOD LOSS:  Minimal.   CLINICAL NOTE:  This 44 year old woman recently appreciated a vague density in the inner aspect of the right breast, and mammography and ultrasound were abnormal. Vacuum biopsy previously completed by the radiology department showed evidence of ADH and ALH. She was felt to be a candidate for excision to determine if a more advanced process was present.   PROCEDURE:  After the induction of general anesthesia and prepping with ChloraPrep, local anesthesia was infiltrated for postoperative analgesia. Ultrasound was used to confirm the previous biopsy site. A curvilinear incision was made over this area, and the skin incised sharply with the remaining dissection completed with electrocautery. A 5 x 5 x 4-cm block of tissue was excised and orientated. Specimen radiograph confirmed the biopsy clip in the center of the specimen. The breast tissue was approximated with 2-0 Vicryl figure-of-eight sutures in multiple layers. The skin was freed with cautery and a 5-mm flap to allow smooth approximation with running 4-0 Vicryl subcuticular suture. Benzoin and Steri-Strips, followed by a Telfa dressing, fluff gauze, and Kerlix were applied, followed by an Ace wrap.   The patient tolerated the procedure well and was taken to the recovery room in stable condition.    ____________________________ Robert Bellow, MD jwb:ms D: 07/03/2013  19:11:37 ET T: 07/03/2013 23:51:42 ET JOB#: [id number]  cc: Robert Bellow, MD, <Dictator> Erin E. Huprich, FNP Gustin Zobrist Amedeo Kinsman MD ELECTRONICALLY SIGNED 07/04/2013 8:30

## 2015-01-06 ENCOUNTER — Encounter: Payer: Self-pay | Admitting: General Surgery

## 2015-01-06 ENCOUNTER — Ambulatory Visit (INDEPENDENT_AMBULATORY_CARE_PROVIDER_SITE_OTHER): Payer: Self-pay | Admitting: General Surgery

## 2015-01-06 VITALS — BP 118/72 | HR 76 | Resp 12 | Ht 68.0 in | Wt 151.0 lb

## 2015-01-06 DIAGNOSIS — D0510 Intraductal carcinoma in situ of unspecified breast: Secondary | ICD-10-CM

## 2015-01-06 MED ORDER — TAMOXIFEN CITRATE 20 MG PO TABS: 20.0000 mg | ORAL_TABLET | Freq: Every day | ORAL | Status: DC

## 2015-01-06 NOTE — Patient Instructions (Addendum)
Patient to return in one year. 

## 2015-01-06 NOTE — Progress Notes (Signed)
Patient ID: Traci Clements, female   DOB: 1970/08/11, 44 y.o.   MRN: [id number]  Chief Complaint  Patient presents with  . Follow-up    discuss Tamoxifen     HPI Traci Clements is a 44 y.o. female here today to discuss Tamoxifen . Patient has lost 23 pound since her last visit on 09/24/13. In August 2015 she started noticed that she was losing weight, this stabilized by early January. She is otherwise felt well, in about 2 months ago [facility] a weight training program. No change in her weight and last 6 months.  She manages a Facilities manager office in [place], and she did not have a follow-up mammogram due to not having insurance.. Last mammogram was 04/2013.  She last made use of tamoxifen about 2 months ago. She had tolerated the medicine fairly well although she did have some night sweats.  She is still having regular menses. HPI  Past Medical History  Diagnosis Date  . 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 Cesarean section  1998  . Tonsillectomy  1990  . Hand surgery Left 2014  . Dilation and curettage of uterus  2006  . Breast surgery Right 05/14/2013    Stereo biopsy, ADH  . Breast surgery Right 07-03-13    DCIS    Family History  Problem Relation Age of Onset  . Breast cancer Maternal Grandmother     great     Social History History  Substance Use Topics  . Smoking status: Current Every Day Smoker -- 0.25 packs/day for 15 years  . Smokeless tobacco: Never Used  . Alcohol Use: Yes    Allergies  Allergen Reactions  . Codeine Nausea Only    Current Outpatient Prescriptions  Medication Sig Dispense Refill  . Multiple Vitamin (MULTIVITAMIN) capsule Take 1 capsule by mouth daily.    . tamoxifen (NOLVADEX) 20 MG tablet Take 1 tablet (20 mg total) by mouth daily. 90 tablet 3   No current facility-administered medications for this visit.     Review of Systems Review of Systems  Constitutional: Negative.   Respiratory: Negative.   Cardiovascular: Negative.     Blood pressure 118/72, pulse 76, resp. rate 12, height 5\' 8"  (1.727 m), weight 151 lb (68.493 kg), last menstrual period 12/23/2014.  Physical Exam Physical Exam  Constitutional: She is oriented to person, place, and time. She appears well-developed.  HENT:  Mouth/Throat: Oropharynx is clear and moist. No oropharyngeal exudate.  Eyes: Conjunctivae are normal. No scleral icterus.  Neck: Neck supple.  Cardiovascular: Normal rate, regular rhythm and normal heart sounds.   Pulmonary/Chest: Effort normal and breath sounds normal. Right breast exhibits no inverted nipple, no mass, no nipple discharge, no skin change and no tenderness. Left breast exhibits no inverted nipple, no mass, no nipple discharge, no skin change and no tenderness.  Right breat well healed incision in the upper quadrant.   Lymphadenopathy:    She has no cervical adenopathy.    She has no axillary adenopathy.  Neurological: She is alert and oriented to person, place, and time.  Skin: Skin is warm and dry.    Data Reviewed Pathology originally showed a 3 mm area of DCIS with associated LCIS, 4 mm minimal margin clearance. ER/PR positive.  The patient underwent whole breast radiation.  Assessment    DCIS of the right  breast.    Plan    Tanya Nones, RN from the [facility] program has been contacted to arrange for repeat diagnostic mammography.  The patient will return in one year with repeat diagnostic mammograms at that time. Patient to return in one year   Rx forTamoxifen sent PCP:  No Pcp Per Patient  Robert Bellow 01/07/2015, 7:46 AM

## 2015-01-13 ENCOUNTER — Encounter: Payer: Self-pay | Admitting: *Deleted

## 2015-01-13 ENCOUNTER — Ambulatory Visit: Payer: Self-pay

## 2015-01-13 ENCOUNTER — Other Ambulatory Visit: Payer: Self-pay | Admitting: Oncology

## 2015-01-13 ENCOUNTER — Ambulatory Visit: Payer: Self-pay | Attending: Oncology | Admitting: *Deleted

## 2015-01-13 ENCOUNTER — Ambulatory Visit
Admission: RE | Admit: 2015-01-13 | Discharge: 2015-01-13 | Disposition: A | Discharge status: Home / Self Care | Payer: Self-pay | Source: Ambulatory Visit | Attending: Oncology | Admitting: Oncology

## 2015-01-13 VITALS — BP 159/82 | HR 80 | Temp 96.7°F | Resp 16 | Wt 154.2 lb

## 2015-01-13 DIAGNOSIS — Z853 Personal history of malignant neoplasm of breast: Secondary | ICD-10-CM

## 2015-01-13 HISTORY — DX: Malignant neoplasm of unspecified site of unspecified female breast: C50.919

## 2015-01-13 NOTE — Progress Notes (Signed)
Subjective:     Patient ID: Traci Clements, female   DOB: Jan 12, 1971, 44 y.o.   MRN: [id number]  HPI   Review of Systems     Objective:   Physical Exam  Pulmonary/Chest: Right breast exhibits no inverted nipple, no mass, no nipple discharge and no skin change. Left breast exhibits no inverted nipple, no mass, no nipple discharge, no skin change and no tenderness. Breasts are asymmetrical.    Abdominal: There is no splenomegaly or hepatomegaly.  Genitourinary:    There is lesion on the right labia. There is no rash, tenderness or injury on the right labia. There is no rash, tenderness, lesion or injury on the left labia. No erythema, tenderness or bleeding in the vagina. No foreign body around the vagina. No signs of injury around the vagina. Vaginal discharge found.   Bartholins cyst on the right labia majora.  White non-odorous vaginal discharge noted.    Assessment:     44 year old Black female presents to [facility] for clinical breast exam and mammogram.  History of right breast cancer of DCIS in 2014.  Treated with lumpectomy, radiation therapy and antihormonal therapy with Tamoxifen.  Clinical breast exam unremarkable.  Taught self breast awareness. Patient states she has had abdominal pain for about 2 weeks, and states she can feel a lump in the RLQ.  There is no palpable mass noted on abdominal exam or pelvic exam. Bartholin cyst noted on the right labia majora.  Also noted on vaginal exam is a white non-odorous discharge.  Patient has been screened for eligibility.  She does not have any insurance, Medicare or Medicaid.  She also meets financial eligibility.  Hand-out given on the Affordable Care Act.    Plan:     Bilateral diagnostic mammogram ordered.  Encouraged patient to follow-up with primary care if her abdominal pain continues.  Completed paperwork for [facility] Medicaid since the patient is currently on Tamoxifen.

## 2015-01-13 NOTE — Patient Instructions (Signed)
Gave patient hand-out, Women Staying Healthy, Active and Well from [facility], with education on breast health, pap smears, heart and colon health. 

## 2015-01-15 ENCOUNTER — Encounter: Payer: Self-pay | Admitting: *Deleted

## 2015-01-15 NOTE — Progress Notes (Signed)
Letter mailed from the [facility] to inform patient of her normal mammogram results.  Patient is to follow-up with annual screening in one year.  HSIS to [place]. 

## 2015-03-26 ENCOUNTER — Ambulatory Visit: Payer: Self-pay | Admitting: Radiation Oncology

## 2015-04-16 ENCOUNTER — Ambulatory Visit
Admission: RE | Admit: 2015-04-16 | Discharge: 2015-04-16 | Disposition: A | Discharge status: Home / Self Care | Payer: Medicaid Other | Source: Ambulatory Visit | Attending: Radiation Oncology | Admitting: Radiation Oncology

## 2015-04-16 ENCOUNTER — Encounter: Payer: Self-pay | Admitting: Radiation Oncology

## 2015-04-16 VITALS — BP 143/87 | HR 90 | Temp 96.9°F | Resp 18 | Wt 160.8 lb

## 2015-04-16 DIAGNOSIS — Z853 Personal history of malignant neoplasm of breast: Secondary | ICD-10-CM

## 2015-04-16 NOTE — Progress Notes (Signed)
Radiation Oncology Follow up Note  Name: Traci Clements   Date:   04/16/2015 MRN:  [id number] DOB: [birth year]    This 44 y.o. female presents to the clinic today for follow-up for breast cancer stage 0 ductal carcinoma in situ ER/PR positive.  REFERRING PROVIDER: No ref. provider found  HPI: Patient is a pleasant 44 year old female now out close to 2 years having completed radiation therapy. To the right breast for ER/PR positive ductal carcinoma in situ status post wide local excision. She is seen today in routine follow-up and is doing well. She's been on tamoxifen all is been causing some abdominal pain centered toward the right side. She's been back on tamoxifen for month and has not had a repeat incidents with that pain. She otherwise is doing well. Mammograms have been fine.  COMPLICATIONS OF TREATMENT: none  FOLLOW UP COMPLIANCE: keeps appointments   PHYSICAL EXAM:  BP 143/87 mmHg  Pulse 90  Temp(Src) 96.9 F (36.1 C)  Resp 18  Wt 160 lb 13.2 oz (72.95 kg) Lungs are clear to A&P cardiac examination essentially unremarkable with regular rate and rhythm. No dominant mass or nodularity is noted in either breast in 2 positions examined. Incision is well-healed. No axillary or supraclavicular adenopathy is appreciated. Cosmetic result is excellent. Well-developed well-nourished patient in NAD. HEENT reveals PERLA, EOMI, discs not visualized.  Oral cavity is clear. No oral mucosal lesions are identified. Neck is clear without evidence of cervical or supraclavicular adenopathy. Lungs are clear to A&P. Cardiac examination is essentially unremarkable with regular rate and rhythm without murmur rub or thrill. Abdomen is benign with no organomegaly or masses noted. Motor sensory and DTR levels are equal and symmetric in the upper and lower extremities. Cranial nerves II through XII are grossly intact. Proprioception is intact. No peripheral adenopathy or edema is identified. No motor or  sensory levels are noted. Crude visual fields are within normal range.  RADIOLOGY RESULTS: Mammograms are reviewed  PLAN: Current time she continues to do well with no evidence of disease. I have told her she can talk to surgeon about switching to possible another aromatase inhibitor if pain persists although I feel that highly unlikely be the source of her pain. Otherwise she continues to do well with excellent cosmetic result. I have asked to see her back in 1 year for follow-up. She knows to call sooner with any concerns.  I would like to take this opportunity for allowing me to participate in the care of your patient.Armstead Peaks., MD

## 2015-11-19 ENCOUNTER — Other Ambulatory Visit: Payer: Self-pay

## 2015-11-19 DIAGNOSIS — D0511 Intraductal carcinoma in situ of right breast: Secondary | ICD-10-CM

## 2016-01-17 ENCOUNTER — Ambulatory Visit
Admission: RE | Admit: 2016-01-17 | Discharge: 2016-01-17 | Disposition: A | Discharge status: Home / Self Care | Payer: Medicaid Other | Source: Ambulatory Visit | Attending: General Surgery | Admitting: General Surgery

## 2016-01-17 ENCOUNTER — Other Ambulatory Visit: Payer: Self-pay | Admitting: General Surgery

## 2016-01-17 DIAGNOSIS — D0511 Intraductal carcinoma in situ of right breast: Secondary | ICD-10-CM

## 2016-01-17 DIAGNOSIS — Z1231 Encounter for screening mammogram for malignant neoplasm of breast: Secondary | ICD-10-CM | POA: Insufficient documentation

## 2016-01-26 ENCOUNTER — Encounter: Payer: Self-pay | Admitting: General Surgery

## 2016-01-26 ENCOUNTER — Ambulatory Visit (INDEPENDENT_AMBULATORY_CARE_PROVIDER_SITE_OTHER): Payer: Medicaid Other | Admitting: General Surgery

## 2016-01-26 VITALS — BP 118/72 | HR 74 | Resp 12 | Ht 69.0 in | Wt 152.0 lb

## 2016-01-26 DIAGNOSIS — D0510 Intraductal carcinoma in situ of unspecified breast: Secondary | ICD-10-CM

## 2016-01-26 MED ORDER — TAMOXIFEN CITRATE 20 MG PO TABS: 20.0000 mg | ORAL_TABLET | Freq: Every day | ORAL | 4 refills | Status: DC

## 2016-01-26 NOTE — Progress Notes (Signed)
Patient ID: Traci Clements, female   DOB: 1971/01/02, 45 y.o.   MRN: [id number]  Chief Complaint  Patient presents with  . Follow-up    mammogram    HPI Traci Clements is a 45 y.o. female who presents for a breast evaluation. The most recent mammogram was done on 01/17/16 .  Patient does perform regular self breast checks and gets regular mammograms done. Patient state she stop taking her tamoxifen in February, due to hair lost. She started Tamoxifen again about one week ago after the radiologist suggested that this would be in her best interest.  The patient reported no other medications change to account for her hair lost, no unusual weight loss as noted in 2016. She continues to exercise vigorously.    HPI  Past Medical History:  Diagnosis Date  . Breast cancer ([place]) 06/2013   with rad tx, right breast  . 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 BREAST BIOPSY Right 1990's   negative  . BREAST BIOPSY Right 2015   papillomas and DCIS  . BREAST SURGERY Right 05/14/2013   Stereo biopsy, ADH  . BREAST SURGERY Right 07-03-13   DCIS  . CESAREAN SECTION  1998  . DILATION AND CURETTAGE OF UTERUS  2006  . HAND SURGERY Left 2014  . TONSILLECTOMY  1990    Family History  Problem Relation Age of Onset  . Breast cancer Maternal Grandmother     great, 70's    Social History Social History  Substance Use Topics  . Smoking status: Current Every Day Smoker    Years: 15.00    Types: Cigarettes  . Smokeless tobacco: Never Used     Comment: 3-5 per day  . Alcohol use 0.0 oz/week    Allergies  Allergen Reactions  . Codeine Nausea Only    Current Outpatient Prescriptions  Medication Sig Dispense Refill  . Multiple Vitamin (MULTIVITAMIN) capsule Take 1 capsule by mouth daily.    . tamoxifen (NOLVADEX) 20 MG tablet Take 1 tablet (20 mg total)  by mouth daily. 90 tablet 3  . tamoxifen (NOLVADEX) 20 MG tablet Take 1 tablet (20 mg total) by mouth daily. 90 tablet 4   No current facility-administered medications for this visit.     Review of Systems Review of Systems  Constitutional: Negative.   Respiratory: Negative.   Cardiovascular: Negative.     Blood pressure 118/72, pulse 74, resp. rate 12, height 5\' 9"  (1.753 m), weight 152 lb (68.9 kg), last menstrual period 01/09/2016.  Physical Exam Physical Exam  Constitutional: She is oriented to person, place, and time. She appears well-developed and well-nourished.  Eyes: Conjunctivae are normal. No scleral icterus.  Neck: Neck supple.  Cardiovascular: Normal rate, regular rhythm and normal heart sounds.   Pulmonary/Chest: Effort normal and breath sounds normal. Right breast exhibits no inverted nipple, no mass, no nipple discharge, no skin change and no tenderness. Left breast exhibits no inverted nipple, no mass, no nipple discharge, no skin change and no tenderness.    Abdominal: Soft. Bowel sounds are normal. There is no tenderness.  Lymphadenopathy:    She has no cervical adenopathy.    She has no axillary adenopathy.  Neurological: She is alert and oriented to person, place, and time.  Skin: Skin is warm and dry.    Data Reviewed Bilateral mammograms dated 01/17/2016  were reviewed. Postsurgical changes based bar on the right, questionable asymmetry in the left. Left breast ultrasound was unremarkable. BI-RADS-2.  Assessment    9 breast exam.  Hair loss possibly related to tamoxifen.    Plan    The patient will continue on her previously prescribed tamoxifen and prescription for 90 day supply was provided. Patient to try biotin cyst with hair loss and if she has recurrent hair loss with reinstitution of tamoxifen will consider a trial of her Evista to, as she is still menstrual period   The patient has been asked to return to the office in one year with a  bilateral diagnostic mammogram. This information has been scribed by Traci Clements CMA.    Robert Bellow 01/26/2016, 8:50 PM

## 2016-01-26 NOTE — Patient Instructions (Addendum)
The patient has been asked to return to the office in one year with a bilateral diagnostic mammogram.Patient to try biotin

## 2016-03-05 DIAGNOSIS — E559 Vitamin D deficiency, unspecified: Secondary | ICD-10-CM

## 2016-03-05 HISTORY — DX: Vitamin D deficiency, unspecified: E55.9

## 2016-03-22 LAB — HM PAP SMEAR: HM PAP: NEGATIVE

## 2016-04-18 ENCOUNTER — Emergency Department
Admission: EM | Admit: 2016-04-18 | Discharge: 2016-04-18 | Disposition: A | Discharge status: Home / Self Care | Payer: No Typology Code available for payment source | Attending: Emergency Medicine | Admitting: Emergency Medicine

## 2016-04-18 ENCOUNTER — Emergency Department: Payer: No Typology Code available for payment source

## 2016-04-18 DIAGNOSIS — F17210 Nicotine dependence, cigarettes, uncomplicated: Secondary | ICD-10-CM | POA: Insufficient documentation

## 2016-04-18 DIAGNOSIS — Y9389 Activity, other specified: Secondary | ICD-10-CM | POA: Insufficient documentation

## 2016-04-18 DIAGNOSIS — M25561 Pain in right knee: Secondary | ICD-10-CM | POA: Insufficient documentation

## 2016-04-18 DIAGNOSIS — Y92410 Unspecified street and highway as the place of occurrence of the external cause: Secondary | ICD-10-CM | POA: Insufficient documentation

## 2016-04-18 DIAGNOSIS — S199XXA Unspecified injury of neck, initial encounter: Secondary | ICD-10-CM | POA: Diagnosis present

## 2016-04-18 DIAGNOSIS — Z79899 Other long term (current) drug therapy: Secondary | ICD-10-CM | POA: Insufficient documentation

## 2016-04-18 DIAGNOSIS — M7918 Myalgia, other site: Secondary | ICD-10-CM

## 2016-04-18 DIAGNOSIS — Y999 Unspecified external cause status: Secondary | ICD-10-CM | POA: Diagnosis not present

## 2016-04-18 DIAGNOSIS — Z853 Personal history of malignant neoplasm of breast: Secondary | ICD-10-CM | POA: Diagnosis not present

## 2016-04-18 DIAGNOSIS — S161XXA Strain of muscle, fascia and tendon at neck level, initial encounter: Secondary | ICD-10-CM | POA: Insufficient documentation

## 2016-04-18 MED ORDER — NAPROXEN 500 MG PO TABS: 500.0000 mg | ORAL_TABLET | Freq: Two times a day (BID) | ORAL | 0 refills | Status: DC

## 2016-04-18 MED ORDER — CYCLOBENZAPRINE HCL 10 MG PO TABS: 10.0000 mg | ORAL_TABLET | Freq: Three times a day (TID) | ORAL | 0 refills | Status: DC | PRN
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
## 2016-04-21 ENCOUNTER — Ambulatory Visit: Payer: Medicaid Other | Admitting: Radiation Oncology

## 2016-05-05 ENCOUNTER — Encounter: Payer: Self-pay | Admitting: Radiation Oncology

## 2016-05-05 ENCOUNTER — Ambulatory Visit
Admission: RE | Admit: 2016-05-05 | Discharge: 2016-05-05 | Disposition: A | Discharge status: Home / Self Care | Payer: Medicaid Other | Source: Ambulatory Visit | Attending: Radiation Oncology | Admitting: Radiation Oncology

## 2016-05-05 VITALS — BP 136/87 | HR 95 | Temp 97.0°F | Wt 153.6 lb

## 2016-05-05 DIAGNOSIS — Z79810 Long term (current) use of selective estrogen receptor modulators (SERMs): Secondary | ICD-10-CM | POA: Insufficient documentation

## 2016-05-05 DIAGNOSIS — D0511 Intraductal carcinoma in situ of right breast: Secondary | ICD-10-CM | POA: Diagnosis not present

## 2016-05-05 DIAGNOSIS — Z923 Personal history of irradiation: Secondary | ICD-10-CM | POA: Insufficient documentation

## 2016-05-05 DIAGNOSIS — Z170 Estrogen receptor positive status [ER+]: Secondary | ICD-10-CM | POA: Diagnosis not present

## 2016-05-05 DIAGNOSIS — D0510 Intraductal carcinoma in situ of unspecified breast: Secondary | ICD-10-CM

## 2016-05-05 NOTE — Progress Notes (Signed)
Radiation Oncology Follow up Note  Name: Traci Clements   Date:   05/05/2016 MRN:  [id number] DOB: 12/22/1970    This 45 y.o. 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  REFERRING PROVIDER: No ref. provider found  HPI: Patient is a 45 year old female now out 2 and half years having completed whole breast radiation to her right breast for ER/PR positive ductal carcinoma in situ status post wide local excision.. She seen today in routine follow-up and is doing well. She did have an interruption her tamoxifen therapy based on hair loss but all has resumed treatments without side effect. She specifically denies breast tenderness cough or bone pain. Most recent mammogram back in August 2017 was benign 1 year follow-up recommended  COMPLICATIONS OF TREATMENT: none  FOLLOW UP COMPLIANCE: keeps appointments   PHYSICAL EXAM:  BP 136/87   Pulse 95   Temp 97 F (36.1 C)   Wt 153 lb 8.8 oz (69.7 kg)   LMP 03/09/2016 (Approximate)   BMI 22.68 kg/m  Lungs are clear to A&P cardiac examination essentially unremarkable with regular rate and rhythm. No dominant mass or nodularity is noted in either breast in 2 positions examined. Incision is well-healed. No axillary or supraclavicular adenopathy is appreciated. Cosmetic result is excellent. Well-developed well-nourished patient in NAD. HEENT reveals PERLA, EOMI, discs not visualized.  Oral cavity is clear. No oral mucosal lesions are identified. Neck is clear without evidence of cervical or supraclavicular adenopathy. Lungs are clear to A&P. Cardiac examination is essentially unremarkable with regular rate and rhythm without murmur rub or thrill. Abdomen is benign with no organomegaly or masses noted. Motor sensory and DTR levels are equal and symmetric in the upper and lower extremities. Cranial nerves II through XII are grossly intact.  Proprioception is intact. No peripheral adenopathy or edema is identified. No motor or sensory levels are noted. Crude visual fields are within normal range.  RADIOLOGY RESULTS: Most recent mammograms reviewed and compatible with the above-stated findings  PLAN: Present time she is to half years out with no evidence of disease doing well. I'm please were overall progress. She will continue on tamoxifen. She will have her follow-up mammograms in August. I will see her back in 1 year for follow-up. She knows to call sooner with any concerns.  I would like to take this opportunity to thank you for allowing me to participate in the care of your patient.Armstead Peaks., MD

## 2016-11-14 ENCOUNTER — Encounter: Payer: Self-pay | Admitting: *Deleted

## 2016-11-14 NOTE — Progress Notes (Signed)
Faxed [employer] Medicaid recertification to DSS.

## 2016-11-16 ENCOUNTER — Other Ambulatory Visit: Payer: Self-pay

## 2016-11-16 DIAGNOSIS — D0511 Intraductal carcinoma in situ of right breast: Secondary | ICD-10-CM

## 2017-01-17 ENCOUNTER — Ambulatory Visit
Admission: RE | Admit: 2017-01-17 | Discharge: 2017-01-17 | Disposition: A | Discharge status: Home / Self Care | Payer: Medicaid Other | Source: Ambulatory Visit | Attending: General Surgery | Admitting: General Surgery

## 2017-01-17 DIAGNOSIS — D0511 Intraductal carcinoma in situ of right breast: Secondary | ICD-10-CM

## 2017-01-17 HISTORY — DX: Personal history of irradiation: Z92.3

## 2017-01-25 ENCOUNTER — Ambulatory Visit (INDEPENDENT_AMBULATORY_CARE_PROVIDER_SITE_OTHER): Payer: Medicaid Other | Admitting: General Surgery

## 2017-01-25 ENCOUNTER — Encounter: Payer: Self-pay | Admitting: General Surgery

## 2017-01-25 VITALS — BP 104/82 | HR 84 | Resp 14 | Ht 68.5 in | Wt 160.0 lb

## 2017-01-25 DIAGNOSIS — D0511 Intraductal carcinoma in situ of right breast: Secondary | ICD-10-CM

## 2017-01-25 DIAGNOSIS — Z1211 Encounter for screening for malignant neoplasm of colon: Secondary | ICD-10-CM

## 2017-01-25 MED ORDER — POLYETHYLENE GLYCOL 3350 17 GM/SCOOP PO POWD: ORAL | 0 refills | Status: DC

## 2017-01-25 NOTE — Patient Instructions (Addendum)
The patient is aware to call back for any questions or concerns.  The patient has been asked to return to the office in one year with a bilateral diagnostic mammogram Colonoscopy, Adult A colonoscopy is an exam to look at the entire large intestine. During the exam, a lubricated, bendable tube is inserted into the anus and then passed into the rectum, colon, and other parts of the large intestine. A colonoscopy is often done as a part of normal colorectal screening or in response to certain symptoms, such as anemia, persistent diarrhea, abdominal pain, and blood in the stool. The exam can help screen for and diagnose medical problems, including:  Tumors.  Polyps.  Inflammation.  Areas of bleeding.  Tell a health care provider about:  Any allergies you have.  All medicines you are taking, including vitamins, herbs, eye drops, creams, and over-the-counter medicines.  Any problems you or family members have had with anesthetic medicines.  Any blood disorders you have.  Any surgeries you have had.  Any medical conditions you have.  Any problems you have had passing stool. What are the risks? Generally, this is a safe procedure. However, problems may occur, including:  Bleeding.  A tear in the intestine.  A reaction to medicines given during the exam.  Infection (rare).  What happens before the procedure? Eating and drinking restrictions Follow instructions from your health care provider about eating and drinking, which may include:  A few days before the procedure - follow a low-fiber diet. Avoid nuts, seeds, dried fruit, raw fruits, and vegetables.  1-3 days before the procedure - follow a clear liquid diet. Drink only clear liquids, such as clear broth or bouillon, black coffee or tea, clear juice, clear soft drinks or sports drinks, gelatin dessert, and popsicles. Avoid any liquids that contain red or purple dye.  On the day of the procedure - do not eat or drink  anything during the 2 hours before the procedure, or within the time period that your health care provider recommends.  Bowel prep If you were prescribed an oral bowel prep to clean out your colon:  Take it as told by your health care provider. Starting the day before your procedure, you will need to drink a large amount of medicated liquid. The liquid will cause you to have multiple loose stools until your stool is almost clear or light green.  If your skin or anus gets irritated from diarrhea, you may use these to relieve the irritation: ? Medicated wipes, such as adult wet wipes with aloe and vitamin E. ? A skin soothing-product like petroleum jelly.  If you vomit while drinking the bowel prep, take a break for up to 60 minutes and then begin the bowel prep again. If vomiting continues and you cannot take the bowel prep without vomiting, call your health care provider.  General instructions  Ask your health care provider about changing or stopping your regular medicines. This is especially important if you are taking diabetes medicines or blood thinners.  Plan to have someone take you home from the hospital or clinic. What happens during the procedure?  An IV tube may be inserted into one of your veins.  You will be given medicine to help you relax (sedative).  To reduce your risk of infection: ? Your health care team will wash or sanitize their hands. ? Your anal area will be washed with soap.  You will be asked to lie on your side with your knees bent.  Your health care provider will lubricate a long, thin, flexible tube. The tube will have a camera and a light on the end.  The tube will be inserted into your anus.  The tube will be gently eased through your rectum and colon.  Air will be delivered into your colon to keep it open. You may feel some pressure or cramping.  The camera will be used to take images during the procedure.  A small tissue sample may be removed  from your body to be examined under a microscope (biopsy). If any potential problems are found, the tissue will be sent to a lab for testing.  If small polyps are found, your health care provider may remove them and have them checked for cancer cells.  The tube that was inserted into your anus will be slowly removed. The procedure may vary among health care providers and hospitals. What happens after the procedure?  Your blood pressure, heart rate, breathing rate, and blood oxygen level will be monitored until the medicines you were given have worn off.  Do not drive for 24 hours after the exam.  You may have a small amount of blood in your stool.  You may pass gas and have mild abdominal cramping or bloating due to the air that was used to inflate your colon during the exam.  It is up to you to get the results of your procedure. Ask your health care provider, or the department performing the procedure, when your results will be ready. This information is not intended to replace advice given to you by your health care provider. Make sure you discuss any questions you have with your health care provider. Document Released: 05/19/2000 Document Revised: 03/22/2016 Document Reviewed: 08/03/2015 Elsevier Interactive Patient Education  2018 [employer].

## 2017-01-25 NOTE — Progress Notes (Signed)
Patient ID: Traci Clements, female   DOB: July 27, 1970, 46 y.o.   MRN: [id number]  Chief Complaint  Patient presents with  . Follow-up    HPI Traci Clements is a 46 y.o. female.  who presents for her follow up breast cancer and a breast evaluation. The most recent mammogram was done on 01-17-17.  Patient does perform regular self breast checks and gets regular mammograms done.  She states the whole right breast seems to have a internal itchy sensation for about 2 months. Denies any color change or rash. Tolerating Tamoxifen.  HPI  Past Medical History:  Diagnosis Date  . BRCA negative 07/15/2013  . Breast cancer ([place]) 06/2013   with rad tx, right breast  . Cancer ([place]) 06/2013   DCIS right breast histologic grade 2, BRACA negative,, ER 90%, PR 90%; wide excision, whole breast radiation  . Personal history of radiation therapy     Past Surgical History:  Procedure Laterality Date  . BREAST BIOPSY Right 1990's   negative  . BREAST BIOPSY Right 2015   papillomas and DCIS  . BREAST SURGERY Right 05/14/2013   Stereo biopsy, [facility]  . BREAST SURGERY Right 07-03-13   DCIS  . CESAREAN SECTION  1998  . DILATION AND CURETTAGE OF UTERUS  2006  . HAND SURGERY Left 2014  . TONSILLECTOMY  1990    Family History  Problem Relation Age of Onset  . Breast cancer Maternal Grandmother        great, 93's    Social History Social History  Substance Use Topics  . Smoking status: Current Every Day Smoker    Years: 15.00    Types: Cigarettes  . Smokeless tobacco: Never Used     Comment: 3-5 per day  . Alcohol use 0.0 oz/week    Allergies  Allergen Reactions  . Codeine Nausea Only    Current Outpatient Prescriptions  Medication Sig Dispense Refill  . cyclobenzaprine (FLEXERIL) 10 MG tablet Take 1 tablet (10 mg total) by mouth 3 (three) times daily as needed for muscle spasms. 30 tablet 0  . metroNIDAZOLE (FLAGYL) 500 MG tablet TAKE TWO TABLETS BY MOUTH TWICE DAILY FOR ONE DAY  0   . Multiple Vitamin (MULTIVITAMIN) capsule Take 1 capsule by mouth daily.    . naproxen (NAPROSYN) 500 MG tablet Take 1 tablet (500 mg total) by mouth 2 (two) times daily with a meal. 30 tablet 0  . tamoxifen (NOLVADEX) 20 MG tablet Take 1 tablet (20 mg total) by mouth daily. 90 tablet 3  . tamoxifen (NOLVADEX) 20 MG tablet Take 1 tablet (20 mg total) by mouth daily. 90 tablet 4  . terconazole (TERAZOL 3) 0.8 % vaginal cream INSERT 1 APPLICATION BY VAGINAL AT BEDTIME FOR 3 DAYS  0  . polyethylene glycol powder (GLYCOLAX/MIRALAX) powder 255 grams one bottle for colonoscopy prep 255 g 0   No current facility-administered medications for this visit.     Review of Systems Review of Systems  Constitutional: Negative.   Respiratory: Negative.   Cardiovascular: Negative.     Blood pressure 104/82, pulse 84, resp. rate 14, height 5' 8.5" (1.74 m), weight 160 lb (72.6 kg), last menstrual period 01/15/2017.  Physical Exam Physical Exam  Constitutional: She is oriented to person, place, and time. She appears well-developed and well-nourished.  HENT:  Mouth/Throat: Oropharynx is clear and moist.  Eyes: Conjunctivae are normal. No scleral icterus.  Neck: Neck supple.  Cardiovascular: Normal rate, regular rhythm and normal heart sounds.  Pulmonary/Chest: Effort normal and breath sounds normal. Right breast exhibits no inverted nipple, no mass, no nipple discharge, no skin change and no tenderness. Left breast exhibits no inverted nipple, no mass, no nipple discharge, no skin change and no tenderness.  Right lumpectomy incision clean  Lymphadenopathy:    She has no cervical adenopathy.    She has no axillary adenopathy.  Neurological: She is alert and oriented to person, place, and time.  Skin: Skin is warm and dry.  Psychiatric: Her behavior is normal.    Data Reviewed Bilateral diagnostic mammograms dated 01/17/2017 were reviewed. By red-2.  Assessment    Doing well now 3 years out  from her DCIS. Tolerating tamoxifen therapy well.    Plan    Plans for 2 additional years of tamoxifen therapy reviewed.  New guidelines regarding screening for colon cancer age 7 discussed.    Colonoscopy with possible biopsy/polypectomy prn: Information regarding the procedure, including its potential risks and complications (including but not limited to perforation of the bowel, which may require emergency surgery to repair, and bleeding) was verbally given to the patient. Educational information regarding lower intestinal endoscopy was given to the patient. Written instructions for how to complete the bowel prep using Miralax were provided. The importance of drinking ample fluids to avoid dehydration as a result of the prep emphasized.   The patient has been asked to return to the office in one year with a bilateral diagnostic mammogram.   HPI, Physical Exam, Assessment and Plan have been scribed under the direction and in the presence of Robert Bellow, MD. Karie Fetch, RN   Robert Bellow 01/26/2017, 4:28 PM  Patient has been scheduled for a colonoscopy on 03-28-17 at [facility]. Miralax prescription has been sent in to the patient's pharmacy today. Colonoscopy instructions have been reviewed with the patient. This patient is aware to call the office if they have further questions.   Dominga Ferry, CMA

## 2017-01-26 ENCOUNTER — Encounter: Payer: Self-pay | Admitting: General Surgery

## 2017-01-26 DIAGNOSIS — Z111 Encounter for screening for respiratory tuberculosis: Secondary | ICD-10-CM | POA: Insufficient documentation

## 2017-01-26 DIAGNOSIS — Z1211 Encounter for screening for malignant neoplasm of colon: Secondary | ICD-10-CM | POA: Insufficient documentation

## 2017-02-06 ENCOUNTER — Telehealth: Payer: Self-pay

## 2017-02-06 NOTE — Telephone Encounter (Signed)
Left message for the patient to call back regarding her colonoscopy. She had left a message with answering service about possible canceling or rescheduling this.

## 2017-02-06 NOTE — Telephone Encounter (Signed)
Spoke with the patient and she has Medicaid due to breast cancer and she was not sure if it would cover he colonoscopy. I provided her with the CPT code, [phone] and she will speak with her case worker about this first. She will remain scheduled for now.

## 2017-02-23 ENCOUNTER — Other Ambulatory Visit: Payer: Self-pay | Admitting: General Surgery

## 2017-02-23 DIAGNOSIS — D0510 Intraductal carcinoma in situ of unspecified breast: Secondary | ICD-10-CM

## 2017-03-21 ENCOUNTER — Encounter: Payer: Self-pay | Admitting: *Deleted

## 2017-03-22 ENCOUNTER — Other Ambulatory Visit: Payer: Self-pay | Admitting: *Deleted

## 2017-03-22 MED ORDER — POLYETHYLENE GLYCOL 3350 17 GM/SCOOP PO POWD: ORAL | 0 refills | Status: DC

## 2017-03-28 ENCOUNTER — Encounter
Admission: RE | Disposition: A | Discharge status: Home / Self Care | Payer: Self-pay | Source: Ambulatory Visit | Attending: General Surgery

## 2017-03-28 ENCOUNTER — Ambulatory Visit
Admission: RE | Admit: 2017-03-28 | Discharge: 2017-03-28 | Disposition: A | Discharge status: Home / Self Care | Payer: Medicaid Other | Source: Ambulatory Visit | Attending: General Surgery | Admitting: General Surgery

## 2017-03-28 ENCOUNTER — Ambulatory Visit: Payer: Medicaid Other | Admitting: Anesthesiology

## 2017-03-28 DIAGNOSIS — Z885 Allergy status to narcotic agent status: Secondary | ICD-10-CM | POA: Insufficient documentation

## 2017-03-28 DIAGNOSIS — Z853 Personal history of malignant neoplasm of breast: Secondary | ICD-10-CM | POA: Insufficient documentation

## 2017-03-28 DIAGNOSIS — Z79899 Other long term (current) drug therapy: Secondary | ICD-10-CM | POA: Insufficient documentation

## 2017-03-28 DIAGNOSIS — Z923 Personal history of irradiation: Secondary | ICD-10-CM | POA: Diagnosis not present

## 2017-03-28 DIAGNOSIS — Z1211 Encounter for screening for malignant neoplasm of colon: Secondary | ICD-10-CM | POA: Diagnosis not present

## 2017-03-28 DIAGNOSIS — F17210 Nicotine dependence, cigarettes, uncomplicated: Secondary | ICD-10-CM | POA: Insufficient documentation

## 2017-03-28 HISTORY — PX: COLONOSCOPY WITH PROPOFOL: SHX5780

## 2017-03-28 LAB — POCT PREGNANCY, URINE: PREG TEST UR: NEGATIVE

## 2017-03-28 SURGERY — COLONOSCOPY WITH PROPOFOL
Anesthesia: General

## 2017-03-28 MED ORDER — LIDOCAINE HCL (PF) 2 % IJ SOLN
INTRAMUSCULAR | Status: AC
  Filled 2017-03-28: qty 10

## 2017-03-28 MED ORDER — SODIUM CHLORIDE 0.9 % IV SOLN
INTRAVENOUS | Status: DC
  Administered 2017-03-28: 1000 mL via INTRAVENOUS

## 2017-03-28 MED ORDER — PROPOFOL 10 MG/ML IV BOLUS
INTRAVENOUS | Status: AC
  Filled 2017-03-28: qty 20

## 2017-03-28 MED ORDER — PROPOFOL 10 MG/ML IV BOLUS
INTRAVENOUS | Status: DC | PRN
  Administered 2017-03-28: 40 mg via INTRAVENOUS

## 2017-03-28 MED ORDER — PROPOFOL 500 MG/50ML IV EMUL
INTRAVENOUS | Status: DC | PRN
  Administered 2017-03-28: 140 ug/kg/min via INTRAVENOUS

## 2017-03-28 NOTE — H&P (Signed)
Traci Clements [id number] Aug 25, 1970     HPI: 46 year old woman for screening colonoscopy.  Tolerated prep well although she did experience some nausea.  No vomiting.  Past history notable for DCIS at age 23.  Prescriptions Prior to Admission  Medication Sig Dispense Refill Last Dose  . cyclobenzaprine (FLEXERIL) 10 MG tablet Take 1 tablet (10 mg total) by mouth 3 (three) times daily as needed for muscle spasms. 30 tablet 0 Past Week at Unknown time  . metroNIDAZOLE (FLAGYL) 500 MG tablet TAKE TWO TABLETS BY MOUTH TWICE DAILY FOR ONE DAY  0 Past Week at Unknown time  . Multiple Vitamin (MULTIVITAMIN) capsule Take 1 capsule by mouth daily.   Past Week at Unknown time  . naproxen (NAPROSYN) 500 MG tablet Take 1 tablet (500 mg total) by mouth 2 (two) times daily with a meal. 30 tablet 0 Past Week at Unknown time  . polyethylene glycol powder (GLYCOLAX/MIRALAX) powder 255 grams one bottle for colonoscopy prep 255 g 0 03/27/2017 at Unknown time  . tamoxifen (NOLVADEX) 20 MG tablet TAKE ONE TABLET BY MOUTH EVERY DAY 90 tablet 3 03/27/2017 at Unknown time  . terconazole (TERAZOL 3) 0.8 % vaginal cream INSERT 1 APPLICATION BY VAGINAL AT BEDTIME FOR 3 DAYS  0 Past Week at Unknown time   Allergies  Allergen Reactions  . Codeine Nausea Only   Past Medical History:  Diagnosis Date  . BRCA negative 07/15/2013  . Breast cancer ([place]) 06/2013   with rad tx, right breast  . Cancer ([place]) 06/2013   DCIS right breast histologic grade 2, BRACA negative,, ER 90%, PR 90%; wide excision, whole breast radiation  . Personal history of radiation therapy    Past Surgical History:  Procedure Laterality Date  . BREAST BIOPSY Right 1990's   negative  . BREAST BIOPSY Right 2015   papillomas and DCIS  . BREAST SURGERY Right 05/14/2013   Stereo biopsy, [facility]  . BREAST SURGERY Right 07-03-13   DCIS  . CESAREAN SECTION  1998  . DILATION AND CURETTAGE OF UTERUS  2006  . HAND SURGERY Left 2014  . TONSILLECTOMY   1990   Social History   Social History  . Marital status: Married    Spouse name: N/A  . Number of children: N/A  . Years of education: N/A   Occupational History  . Not on file.   Social History Main Topics  . Smoking status: Current Every Day Smoker    Years: 15.00    Types: Cigarettes  . Smokeless tobacco: Never Used     Comment: 3-5 per day  . Alcohol use 0.0 oz/week  . Drug use: No  . Sexual activity: Not on file   Other Topics Concern  . Not on file   Social History Narrative  . No narrative on file   Social History   Social History Narrative  . No narrative on file     ROS: Negative.     PE: HEENT: Negative. Lungs: Clear. Cardio: RR.  Assessment/Plan:  Proceed with planned endoscopy.   Robert Bellow 03/28/2017

## 2017-03-28 NOTE — Anesthesia Preprocedure Evaluation (Signed)
Anesthesia Evaluation  Patient identified by MRN, date of birth, ID band Patient awake    Reviewed: Allergy & Precautions, NPO status , Patient's Chart, lab work & pertinent test results  Airway Mallampati: I       Dental  (+) Teeth Intact   Pulmonary neg pulmonary ROS, Current Smoker,    breath sounds clear to auscultation       Cardiovascular Exercise Tolerance: Good  Rhythm:Regular     Neuro/Psych negative neurological ROS     GI/Hepatic negative GI ROS, Neg liver ROS,   Endo/Other  negative endocrine ROS  Renal/GU negative Renal ROS  negative genitourinary   Musculoskeletal   Abdominal Normal abdominal exam  (+)   Peds negative pediatric ROS (+)  Hematology negative hematology ROS (+)   Anesthesia Other Findings   Reproductive/Obstetrics                             Anesthesia Physical Anesthesia Plan  ASA: II  Anesthesia Plan: General   Post-op Pain Management:    Induction: Intravenous  PONV Risk Score and Plan:   Airway Management Planned: Natural Airway and Nasal Cannula  Additional Equipment:   Intra-op Plan:   Post-operative Plan:   Informed Consent: I have reviewed the patients History and Physical, chart, labs and discussed the procedure including the risks, benefits and alternatives for the proposed anesthesia with the patient or authorized representative who has indicated his/her understanding and acceptance.     Plan Discussed with: Surgeon  Anesthesia Plan Comments:         Anesthesia Quick Evaluation

## 2017-03-28 NOTE — Op Note (Signed)
[facility] Gastroenterology Patient Name: Traci Clements Procedure Date: 03/28/2017 10:25 AM MRN: [id number] Account #: [id number] Date of Birth: [birth year] Admit Type: Outpatient Age: 46 Room: [facility] ENDO ROOM 1 Gender: Female Note Status: Finalized Procedure:            Colonoscopy Indications:          Screening for colorectal malignant neoplasm Providers:            Robert Bellow, MD Referring MD:         No Local Md, MD (Referring MD) Medicines:            Monitored Anesthesia Care Complications:        No immediate complications. Procedure:            Pre-Anesthesia Assessment:                       - Prior to the procedure, a History and Physical was                        performed, and patient medications, allergies and                        sensitivities were reviewed. The patient's tolerance of                        previous anesthesia was reviewed.                       - The risks and benefits of the procedure and the                        sedation options and risks were discussed with the                        patient. All questions were answered and informed                        consent was obtained.                       After obtaining informed consent, the colonoscope was                        passed under direct vision. Throughout the procedure,                        the patient's blood pressure, pulse, and oxygen                        saturations were monitored continuously. The                        Colonoscope was introduced through the anus and                        advanced to the the cecum, identified by appendiceal                        orifice and ileocecal valve. The colonoscopy was  performed without difficulty. The patient tolerated the                        procedure well. The quality of the bowel preparation                        was excellent. Findings:      The entire examined colon  appeared normal on direct and retroflexion       views. Impression:           - The entire examined colon is normal on direct and                        retroflexion views.                       - No specimens collected. Recommendation:       - Repeat colonoscopy in 10 years for screening purposes. Procedure Code(s):    --- Professional ---                       [id number], Colonoscopy, flexible; diagnostic, including                        collection of specimen(s) by brushing or washing, when                        performed (separate procedure) Diagnosis Code(s):    --- Professional ---                       Z12.11, Encounter for screening for malignant neoplasm                        of colon CPT copyright 2016 American Medical Association. All rights reserved. The codes documented in this report are preliminary and upon coder review may  be revised to meet current compliance requirements. Robert Bellow, MD 03/28/2017 10:45:43 AM This report has been signed electronically. Number of Addenda: 0 Note Initiated On: 03/28/2017 10:25 AM Scope Withdrawal Time: 0 hours 8 minutes 33 seconds  Total Procedure Duration: 0 hours 12 minutes 52 seconds       [facility]

## 2017-03-28 NOTE — Transfer of Care (Signed)
Immediate Anesthesia Transfer of Care Note  Patient: Traci Clements  Procedure(s) Performed: COLONOSCOPY WITH PROPOFOL (N/A )  Patient Location: PACU  Anesthesia Type:General  Level of Consciousness: awake  Airway & Oxygen Therapy: Patient Spontanous Breathing and Patient connected to nasal cannula oxygen  Post-op Assessment: Report given to RN and Post -op Vital signs reviewed and stable  Post vital signs: Reviewed and stable  Last Vitals:  Vitals:   03/28/17 0959  BP: (!) 122/91  Pulse: 82  Resp: 18  Temp: (!) 36.3 C  SpO2: 100%    Last Pain:  Vitals:   03/28/17 0959  TempSrc: Tympanic         Complications: No apparent anesthesia complications

## 2017-03-28 NOTE — Anesthesia Postprocedure Evaluation (Signed)
Anesthesia Post Note  Patient: Traci Clements  Procedure(s) Performed: COLONOSCOPY WITH PROPOFOL (N/A )  Patient location during evaluation: PACU Anesthesia Type: General Level of consciousness: awake Pain management: pain level controlled Vital Signs Assessment: post-procedure vital signs reviewed and stable Respiratory status: spontaneous breathing Cardiovascular status: stable Anesthetic complications: no     Last Vitals:  Vitals:   03/28/17 0959  BP: (!) 122/91  Pulse: 82  Resp: 18  Temp: (!) 36.3 C  SpO2: 100%    Last Pain:  Vitals:   03/28/17 0959  TempSrc: Tympanic                 VAN STAVEREN,Nylani Michetti

## 2017-03-28 NOTE — Anesthesia Post-op Follow-up Note (Signed)
Anesthesia QCDR form completed.        

## 2017-03-30 ENCOUNTER — Encounter: Payer: Self-pay | Admitting: General Surgery

## 2017-04-11 ENCOUNTER — Ambulatory Visit: Payer: Self-pay | Admitting: Obstetrics and Gynecology

## 2017-04-25 ENCOUNTER — Encounter: Payer: Self-pay | Admitting: Obstetrics and Gynecology

## 2017-04-25 ENCOUNTER — Ambulatory Visit (INDEPENDENT_AMBULATORY_CARE_PROVIDER_SITE_OTHER): Payer: Medicaid Other | Admitting: Obstetrics and Gynecology

## 2017-04-25 VITALS — BP 100/60 | HR 93 | Ht 69.0 in | Wt 157.0 lb

## 2017-04-25 DIAGNOSIS — Z0000 Encounter for general adult medical examination without abnormal findings: Secondary | ICD-10-CM | POA: Diagnosis not present

## 2017-04-25 DIAGNOSIS — Z1379 Encounter for other screening for genetic and chromosomal anomalies: Secondary | ICD-10-CM

## 2017-04-25 DIAGNOSIS — Z1239 Encounter for other screening for malignant neoplasm of breast: Secondary | ICD-10-CM

## 2017-04-25 DIAGNOSIS — Z01419 Encounter for gynecological examination (general) (routine) without abnormal findings: Secondary | ICD-10-CM | POA: Diagnosis not present

## 2017-04-25 DIAGNOSIS — C50911 Malignant neoplasm of unspecified site of right female breast: Secondary | ICD-10-CM

## 2017-04-25 DIAGNOSIS — Z124 Encounter for screening for malignant neoplasm of cervix: Secondary | ICD-10-CM

## 2017-04-25 DIAGNOSIS — Z79810 Long term (current) use of selective estrogen receptor modulators (SERMs): Secondary | ICD-10-CM

## 2017-04-25 DIAGNOSIS — Z170 Estrogen receptor positive status [ER+]: Secondary | ICD-10-CM

## 2017-04-25 NOTE — Patient Instructions (Signed)
I value your feedback and entrusting us with your care. If you get a [facility] patient survey, I would appreciate you taking the time to let us know about your experience today. Thank you! 

## 2017-04-25 NOTE — Progress Notes (Signed)
PCP:  Patient, No Pcp Per   Chief Complaint  Patient presents with  . Gynecologic Exam     HPI:      Ms. Traci Clements is a 46 y.o. G2P0010 who LMP was No LMP recorded., presents today for her annual examination.  Her menses are regular every 28-30 days, lasting 5 days.  She missed 3 months of menses last fall, however.  Dysmenorrhea moderate, occurring first 1-2 days of flow. She does not have intermenstrual bleeding. She is on tamoxifen and is aware of risks of Rx and DUB. She does have occas hot flashes.  Sex activity: single partner, contraception - condoms . Declines other BC.  Last Pap: March 22, 2016  Results were: no abnormalities /neg HPV DNA  Hx of STDs: cervical dysplasia in the past.  Last mammogram: January 17, 2017  Results were: normal--routine follow-up in 12 months. Followed by Dr. Bary Castilla. There is a FH of breast cancer in her [place]. Pt diagnosed at age 71. Pt is BRCA/BART neg, not had update testing due to insurance coverage in the past. There is no FH of ovarian cancer. The patient does do self-breast exams.  Tobacco use: The patient currently smokes 1/2 packs of cigarettes per day for the past many years. Alcohol use: social drinker No drug use.  Exercise: moderately active  She does get adequate calcium and Vitamin D in her diet.  Normal lipids last yr. Pt was Vit D deficient but is taking supp daily.   She had colonoscopy this yr and is due again in 5 yrs due to hx of breast cancer, per pt report.  Past Medical History:  Diagnosis Date  . BRCA negative 07/15/2013  . Breast cancer ([place]) 06/2013   with rad tx, right breast  . 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 Personal history of radiation therapy     Past Surgical History:  Procedure Laterality Date  . BREAST BIOPSY Right 1990's   negative  . BREAST BIOPSY Right 2015   papillomas and DCIS  .  BREAST SURGERY Right 05/14/2013   Stereo biopsy, ADH  . BREAST SURGERY Right 07-03-13   DCIS  . CESAREAN SECTION  1998  . COLONOSCOPY WITH PROPOFOL N/A 03/28/2017   Procedure: COLONOSCOPY WITH PROPOFOL;  Surgeon: Robert Bellow, MD;  Location: [facility] ENDOSCOPY;  Service: Endoscopy;  Laterality: N/A;  . DILATION AND CURETTAGE OF UTERUS  2006  . HAND SURGERY Left 2014  . TONSILLECTOMY  1990    Family History  Problem Relation Age of Onset  . Breast cancer Maternal Grandmother        great, 60's  . Hypertension Mother   . Diabetes Maternal Grandfather   . Hyperlipidemia Maternal Grandfather   . Diabetes Paternal Grandmother   . Hyperlipidemia Paternal Grandmother     Social History   Socioeconomic History  . Marital status: Married    Spouse name: Not on file  . Number of children: Not on file  . Years of education: Not on file  . Highest education level: Not on file  Social Needs  . Financial resource strain: Not on file  . Food insecurity - worry: Not on file  . Food insecurity - inability: Not on file  . Transportation needs - medical: Not on file  . Transportation needs - non-medical: Not on file  Occupational History  . Not on file  Tobacco Use  . Smoking status: Current Every Day Smoker    Years: 15.00    Types: Cigarettes  . Smokeless tobacco: Never Used  . Tobacco comment: 3-5 per day  Substance and Sexual Activity  . Alcohol use: Yes    Alcohol/week: 0.0 oz  . Drug use: No  . Sexual activity: Yes    Birth control/protection: Condom  Other Topics Concern  . Not on file  Social History Narrative  . Not on file    Current Meds  Medication Sig  . tamoxifen (NOLVADEX) 20 MG tablet TAKE ONE TABLET BY MOUTH EVERY DAY     ROS:  Review of Systems  Constitutional: Negative for fatigue, fever and unexpected weight change.  Respiratory: Negative for cough, shortness of breath and wheezing.   Cardiovascular: Negative for chest pain, palpitations and leg  swelling.  Gastrointestinal: Negative for blood in stool, constipation, diarrhea, nausea and vomiting.  Endocrine: Negative for cold intolerance, heat intolerance and polyuria.  Genitourinary: Positive for vaginal discharge. Negative for dyspareunia, dysuria, flank pain, frequency, genital sores, hematuria, menstrual problem, pelvic pain, urgency, vaginal bleeding and vaginal pain.  Musculoskeletal: Negative for back pain, joint swelling and myalgias.  Skin: Negative for rash.  Neurological: Negative for dizziness, syncope, light-headedness, numbness and headaches.  Hematological: Negative for adenopathy.  Psychiatric/Behavioral: Negative for agitation, confusion, sleep disturbance and suicidal ideas. The patient is not nervous/anxious.      Objective: BP 100/60   Pulse 93   Ht _0  (1.753 m)   Wt 157 lb (71.2 kg)   BMI 23.18 kg/m    Physical Exam  Constitutional: She is oriented to person, place, and time. She appears well-developed and well-nourished.  Genitourinary: Vagina normal and uterus normal. There is no rash or tenderness on the right labia. There is no rash or tenderness on the left labia. No erythema or tenderness in the vagina. No vaginal discharge found. Right adnexum does not display mass and does not display tenderness. Left adnexum does not display mass and does not display tenderness. Cervix does not exhibit motion tenderness or polyp. Uterus is not enlarged or tender.  Neck: Normal range of motion. No thyromegaly present.  Cardiovascular: Normal rate, regular rhythm and normal heart sounds.  No murmur heard. Pulmonary/Chest: Effort normal and breath sounds normal. Right breast exhibits no mass, no nipple discharge, no skin change and no tenderness. Left breast exhibits no mass, no nipple discharge, no skin change and no tenderness.  Abdominal: Soft. There is no tenderness. There is no guarding.  Musculoskeletal: Normal range of motion.  Neurological: She is alert and  oriented to person, place, and time. No cranial nerve deficit.  Psychiatric: She has a normal mood and affect. Her behavior is normal.  Vitals reviewed.   Assessment/Plan: Encounter for annual routine gynecological examination  Cervical cancer screening - Plan: IGP, rfx Aptima HPV ASCU  Screening for breast cancer - Pt followed by Dr. Dondra Prader. Current on mammo.  Malignant neoplasm of right breast in female, estrogen receptor positive, unspecified site of breast ([place]) - Pt followed by Dr. Bary Castilla. On tamoxifen. No DUB sx-- f/u if occurs. BRCA neg, qualifies for update testing. Done today. Will call pt wiht results. Cont Vit D3.  Genetic testing of female - MyRisk update testing done today. Will call pt with results.  Care related to current tamoxifen use - Pt aware to f/u prn DUB sx.  GYN counsel breast self exam, mammography screening, adequate intake of calcium and vitamin D, diet  and exercise     F/U  Return in about 1 year (around 04/25/2018).  Tandy Grawe B. Kree Armato, PA-C 04/25/2017 2:09 PM

## 2017-05-03 ENCOUNTER — Encounter: Payer: Self-pay | Admitting: Obstetrics and Gynecology

## 2017-05-03 LAB — IGP, RFX APTIMA HPV ASCU: PAP SMEAR COMMENT: 0

## 2017-05-03 LAB — HPV APTIMA: HPV APTIMA: NEGATIVE

## 2017-05-04 ENCOUNTER — Telehealth: Payer: Self-pay

## 2017-05-04 NOTE — Telephone Encounter (Signed)
Traci Clements w/[facility] calling. [facility] ordered update My Risk test for this patient. They ran previous testing on her in 2015 that included the My Risk testing. They ran integrated BRCA w/my risk although it wasn't the current My Risk test order. Inquiring if [facility] wants them to fax her her previous or if she wants them to run the current version. [facility] can contact Medical Services to get an explanation of the difference '@800' [phone]. Contact Traci Clements [phone] to give a verbal to run current My Risk test.

## 2017-05-08 ENCOUNTER — Encounter: Payer: Self-pay | Admitting: Obstetrics and Gynecology

## 2017-05-08 NOTE — Telephone Encounter (Signed)
Pt had MyRisk testing done through [place]. Cancel my "update" testing. Cyril Mourning will send [place] copy of previous results for our records. Pt notified.

## 2017-05-18 ENCOUNTER — Ambulatory Visit: Payer: Medicaid Other | Admitting: Radiation Oncology

## 2017-06-22 ENCOUNTER — Other Ambulatory Visit: Payer: Self-pay

## 2017-06-22 ENCOUNTER — Encounter: Payer: Self-pay | Admitting: Radiation Oncology

## 2017-06-22 ENCOUNTER — Ambulatory Visit
Admission: RE | Admit: 2017-06-22 | Discharge: 2017-06-22 | Disposition: A | Discharge status: Home / Self Care | Payer: Medicaid Other | Source: Ambulatory Visit | Attending: Radiation Oncology | Admitting: Radiation Oncology

## 2017-06-22 VITALS — BP 130/87 | HR 87 | Temp 97.8°F | Resp 16 | Wt 154.7 lb

## 2017-06-22 DIAGNOSIS — Z79810 Long term (current) use of selective estrogen receptor modulators (SERMs): Secondary | ICD-10-CM | POA: Diagnosis not present

## 2017-06-22 DIAGNOSIS — D0510 Intraductal carcinoma in situ of unspecified breast: Secondary | ICD-10-CM

## 2017-06-22 DIAGNOSIS — Z170 Estrogen receptor positive status [ER+]: Secondary | ICD-10-CM | POA: Insufficient documentation

## 2017-06-22 DIAGNOSIS — D0511 Intraductal carcinoma in situ of right breast: Secondary | ICD-10-CM | POA: Insufficient documentation

## 2017-06-22 DIAGNOSIS — Z923 Personal history of irradiation: Secondary | ICD-10-CM | POA: Diagnosis not present

## 2017-06-22 NOTE — Progress Notes (Signed)
Radiation Oncology Follow up Note  Name: Traci Clements   Date:   06/22/2017 MRN:  [id number] DOB: [birth year]    This 47 y.o. female presents to the clinic today for a 3.5 year follow-up status post whole breast radiation to her right breast for ER/PR positive ductal carcinoma in situ.  REFERRING PROVIDER: No ref. provider found  HPI: Patient is a 47 year old female now out 3.5 years having completed whole breast radiation to her right breast for ER/PR positive ductal carcinoma in situ. Seen today in routine follow-up she is doing well. She specifically denies breast tenderness cough or bone pain. She is currently on. Tamoxifen tolerating that well without side effect. Her last mammogram and ultrasound which are both reviewed were done back in August showing BI-RADS 2 benign disease.  COMPLICATIONS OF TREATMENT: none  FOLLOW UP COMPLIANCE: keeps appointments   PHYSICAL EXAM:  BP 130/87   Pulse 87   Temp 97.8 F (36.6 C)   Resp 16   Wt 154 lb 10.4 oz (70.1 kg)   BMI 22.84 kg/m  Lungs are clear to A&P cardiac examination essentially unremarkable with regular rate and rhythm. No dominant mass or nodularity is noted in either breast in 2 positions examined. Incision is well-healed. No axillary or supraclavicular adenopathy is appreciated. Cosmetic result is excellent. Well-developed well-nourished patient in NAD. HEENT reveals PERLA, EOMI, discs not visualized.  Oral cavity is clear. No oral mucosal lesions are identified. Neck is clear without evidence of cervical or supraclavicular adenopathy. Lungs are clear to A&P. Cardiac examination is essentially unremarkable with regular rate and rhythm without murmur rub or thrill. Abdomen is benign with no organomegaly or masses noted. Motor sensory and DTR levels are equal and symmetric in the upper and lower extremities. Cranial nerves II through XII are grossly intact. Proprioception is intact. No peripheral adenopathy or edema is identified.  No motor or sensory levels are noted. Crude visual fields are within normal range.  RADIOLOGY RESULTS: Mammogram and ultrasound reviewed and compatible with the above-stated findings  PLAN: Present time she continues to do well with no evidence of disease now out 3.5 years. I've asked to see her back in 1 year for follow-up and then will discontinue follow-up care. She continues on tamoxifen without side effect. She or he has follow-up mammograms ordered. Patient knows to call with any concerns.  I would like to take this opportunity to thank you for allowing me to participate in the care of your patient.Noreene Filbert, MD

## 2017-10-20 IMAGING — MG MM DIGITAL DIAGNOSTIC BILAT W/ TOMO W/ CAD
9 of 13 series · 9 of 29 positions shown · non-contrast
Comparison: Previous exam(s).

CLINICAL DATA: 45-year-old female with history of right lumpectomy
in 5831 demonstrating ductal carcinoma in situ, lobular carcinoma in
situ, fibrocystic changes, and intraductal papilloma. Lumpectomy
margins were negative.

EXAM:
2D DIGITAL DIAGNOSTIC BILATERAL MAMMOGRAM WITH CAD AND ADJUNCT TOMO

[R CC (1 of 2)]
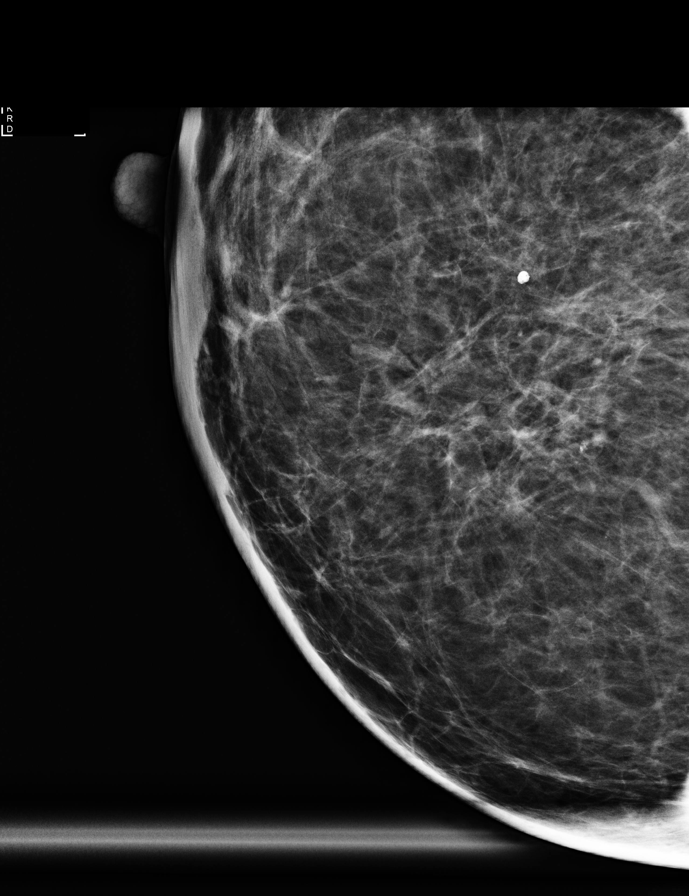

[R CC synth-2D]
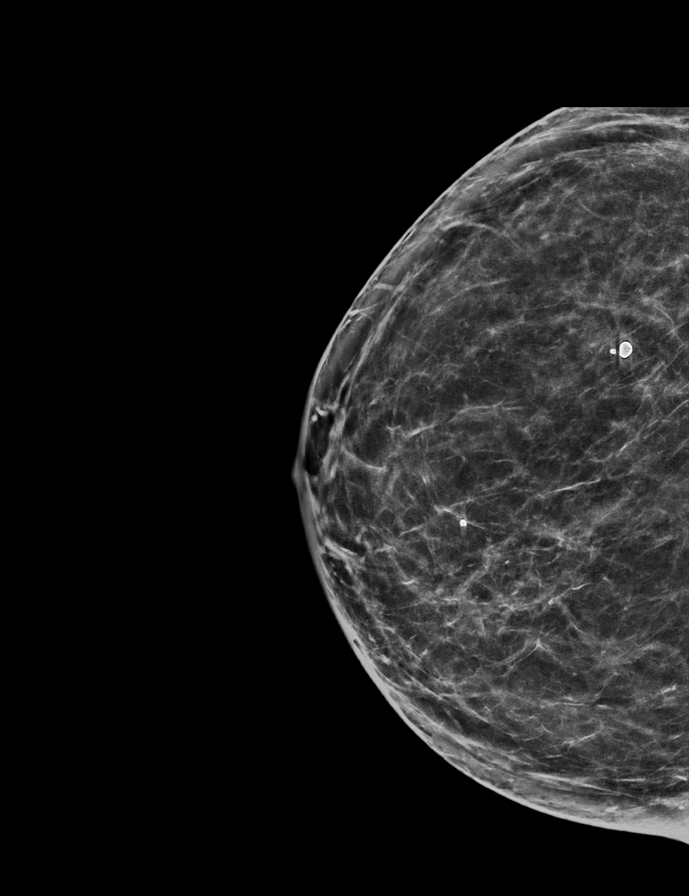

[L CC synth-2D]
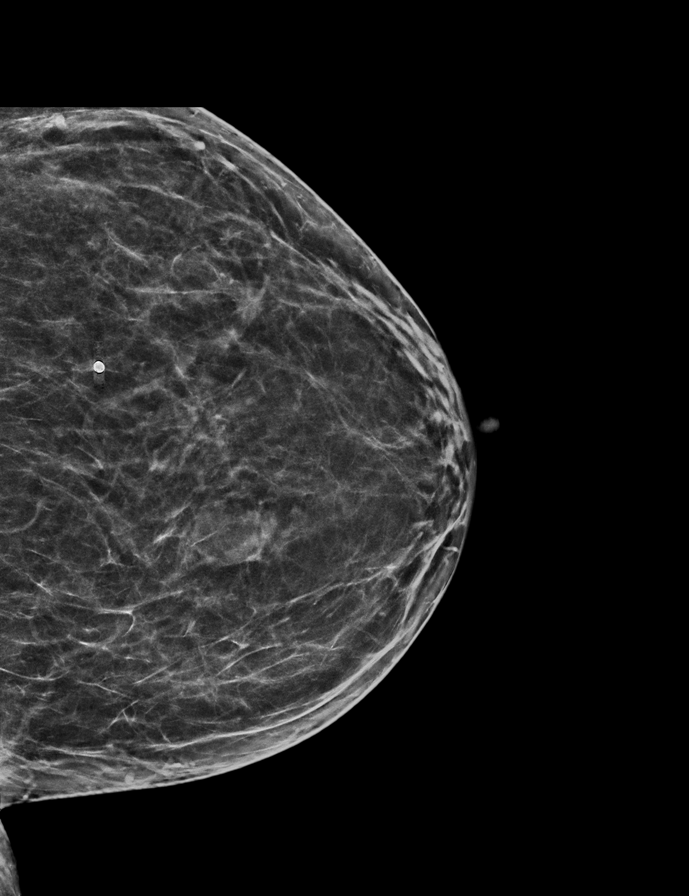

[L MLO]
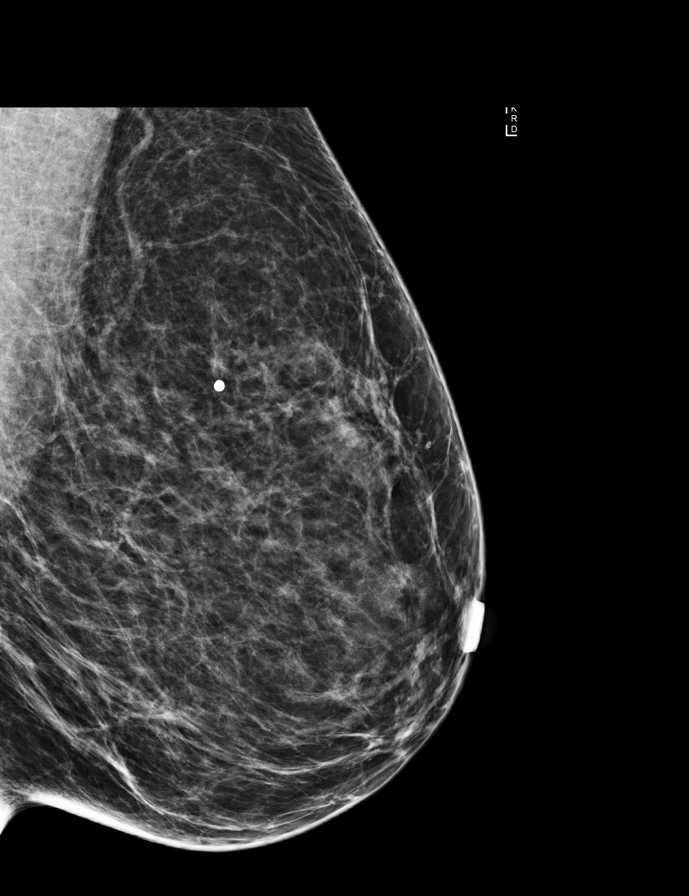

[R CC (2 of 2)]
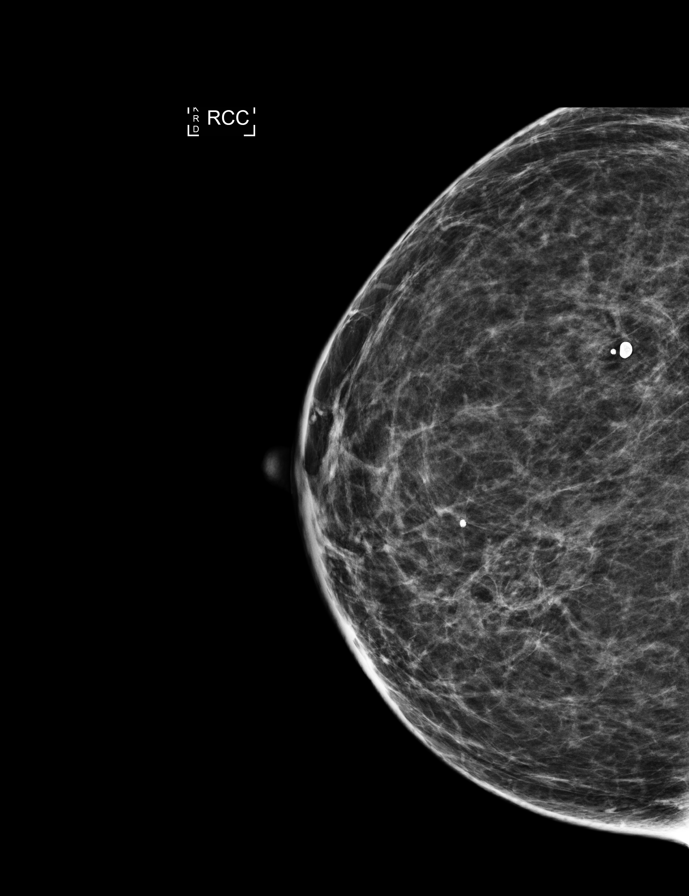

[R MLO]
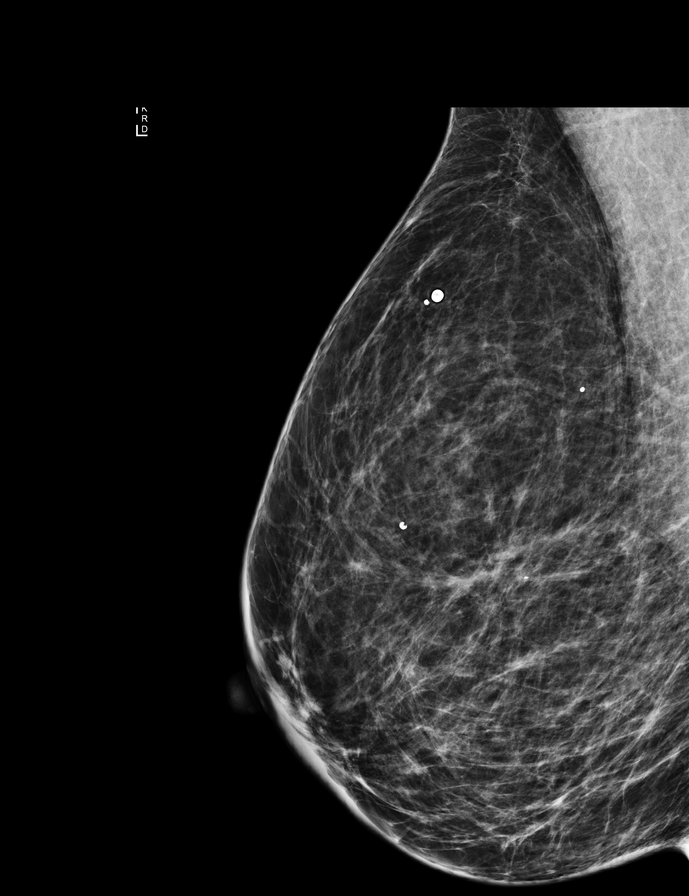

[L MLO synth-2D]
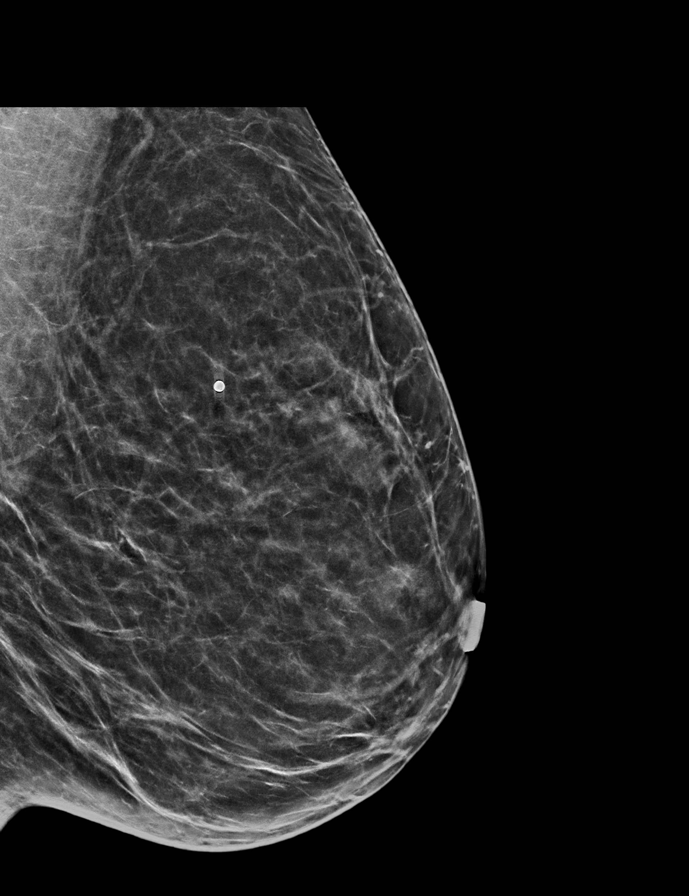

[R MLO synth-2D]
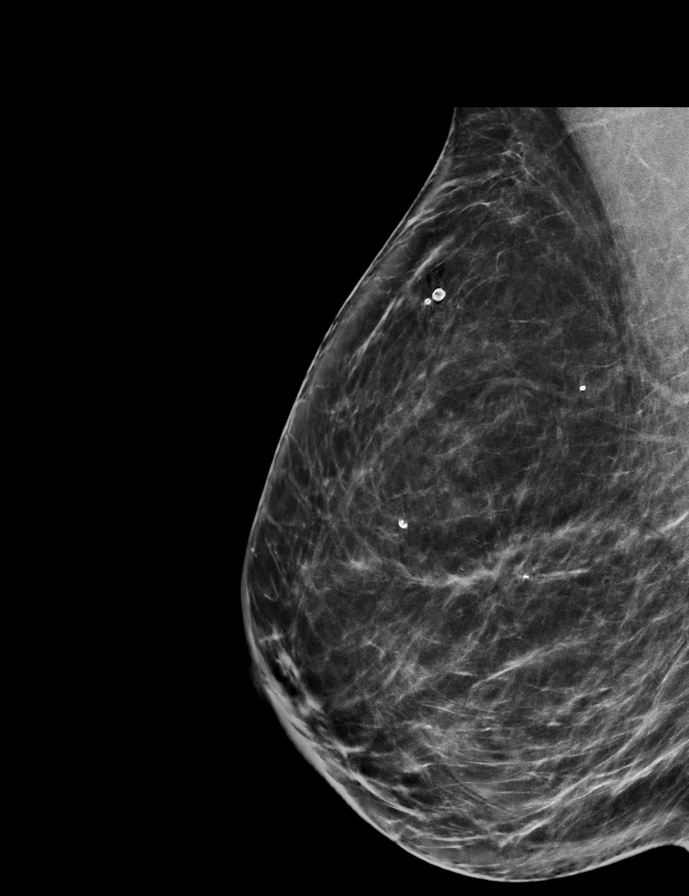

[L CC]
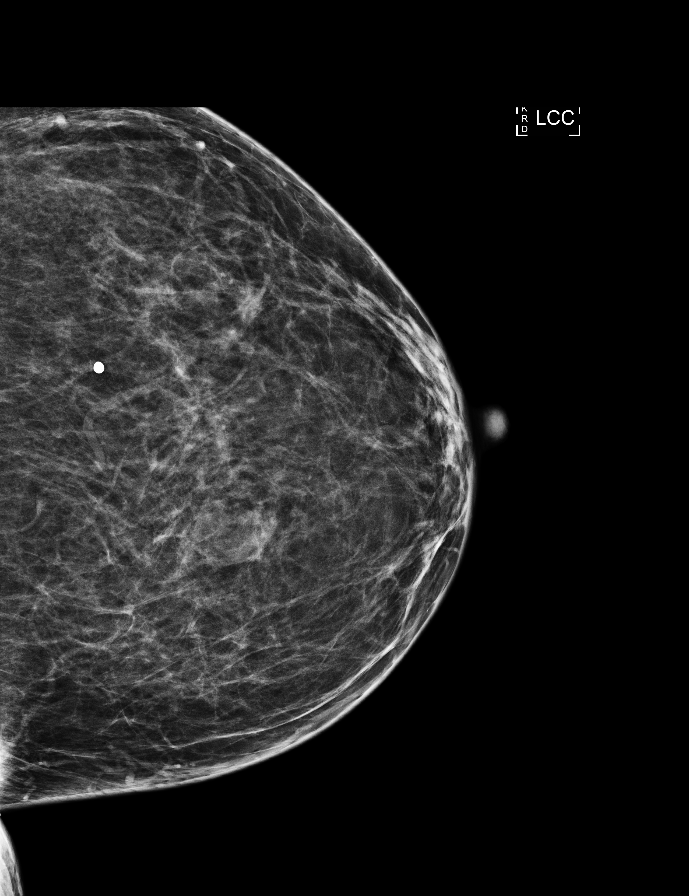

[9 of 29 positions shown; findings below may reference images not displayed]

ACR Breast Density Category b: There are scattered areas of
fibroglandular density.
FINDINGS: Postlumpectomy changes are again seen in the medial right breast. No
suspicious mass, calcifications, or other abnormality is identified
within either breast.

Mammographic images were processed with CAD.
IMPRESSION: No mammographic evidence of malignancy.

RECOMMENDATION:
Bilateral diagnostic mammogram in 1 year.

I have discussed the findings and recommendations with the patient.
Results were also provided in writing at the conclusion of the
visit. If applicable, a reminder letter will be sent to the patient
regarding the next appointment.

BI-RADS CATEGORY  2: Benign.

## 2017-11-28 ENCOUNTER — Other Ambulatory Visit: Payer: Self-pay

## 2017-11-28 DIAGNOSIS — D0511 Intraductal carcinoma in situ of right breast: Secondary | ICD-10-CM

## 2018-01-30 ENCOUNTER — Ambulatory Visit
Admission: RE | Admit: 2018-01-30 | Discharge: 2018-01-30 | Disposition: A | Discharge status: Home / Self Care | Payer: Medicaid Other | Source: Ambulatory Visit | Attending: General Surgery | Admitting: General Surgery

## 2018-01-30 DIAGNOSIS — D0511 Intraductal carcinoma in situ of right breast: Secondary | ICD-10-CM

## 2018-02-07 ENCOUNTER — Ambulatory Visit: Payer: Medicaid Other | Admitting: General Surgery

## 2018-03-06 ENCOUNTER — Other Ambulatory Visit: Payer: Self-pay | Admitting: General Surgery

## 2018-03-06 DIAGNOSIS — D0510 Intraductal carcinoma in situ of unspecified breast: Secondary | ICD-10-CM

## 2018-03-07 ENCOUNTER — Encounter: Payer: Self-pay | Admitting: General Surgery

## 2018-03-07 ENCOUNTER — Ambulatory Visit: Payer: Medicaid Other | Admitting: General Surgery

## 2018-03-07 VITALS — BP 136/82 | HR 80 | Resp 14 | Ht 69.0 in | Wt 149.0 lb

## 2018-03-07 DIAGNOSIS — D0511 Intraductal carcinoma in situ of right breast: Secondary | ICD-10-CM | POA: Diagnosis not present

## 2018-03-07 NOTE — Patient Instructions (Addendum)
The patient is aware to call back for any questions or new concerns. The patient has been asked to return to the office in one year with a bilateral diagnostic mammogram.  

## 2018-03-07 NOTE — Progress Notes (Signed)
Patient ID: Traci Clements, female   DOB: 14-Jul-1970, 47 y.o.   MRN: [id number]  Chief Complaint  Patient presents with  . Follow-up    HPI Traci Clements is a 47 y.o. female who presents for her follow up right breast cancer and a breast evaluation. The most recent mammogram was done on 01/30/18. Patient does perform regular self breast checks and gets regular mammograms done.  No new breast issues. She does notice some "muscle cramps" but in her right chest and middle abdomen. Both occur at random times. Bending over may trigger the episodes, lasting less than a minute. Not bending over and readjusting her position does help. Her daughter, Traci Clements age 41, had a breast biopsy showing fibroadenoma this year,  she has an appointment 03-21-18.  HPI  Past Medical History:  Diagnosis Date  . BRCA negative 07/15/2013   MyRisk neg  . Breast cancer ([place]) 06/2013   with rad tx, right breast  . 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 Personal history of radiation therapy 2015   RIGHT lumpectomy    Past Surgical History:  Procedure Laterality Date  . BREAST BIOPSY Right 1990's   negative  . BREAST BIOPSY Right 2015   papillomas and DCIS  . BREAST LUMPECTOMY Right 2015   RIGHT lumpectomy  . BREAST SURGERY Right 05/14/2013   Stereo biopsy, ADH  . BREAST SURGERY Right 07-03-13   DCIS  . CESAREAN SECTION  1998  . COLONOSCOPY WITH PROPOFOL N/A 03/28/2017   Procedure: COLONOSCOPY WITH PROPOFOL;  Surgeon: Robert Bellow, MD;  Location: [facility] ENDOSCOPY;  Service: Endoscopy;  Laterality: N/A;  . DILATION AND CURETTAGE OF UTERUS  2006  . HAND SURGERY Left 2014  . TONSILLECTOMY  1990    Family History  Problem Relation Age of Onset  . Breast cancer Maternal Grandmother        great, 60's  . Hypertension Mother   . Diabetes Maternal Grandfather   . Hyperlipidemia Maternal Grandfather   .  Diabetes Paternal Grandmother   . Hyperlipidemia Paternal Grandmother     Social History Social History   Tobacco Use  . Smoking status: Current Every Day Smoker    Packs/day: 5.00    Years: 15.00    Pack years: 75.00    Types: Cigarettes  . Smokeless tobacco: Never Used  Substance Use Topics  . Alcohol use: Yes    Alcohol/week: 0.0 standard drinks  . Drug use: No    Allergies  Allergen Reactions  . Codeine Nausea Only    Current Outpatient Medications  Medication Sig Dispense Refill  . Multiple Vitamin (MULTIVITAMIN) capsule Take 1 capsule by mouth daily.    . tamoxifen (NOLVADEX) 20 MG tablet TAKE ONE TABLET BY MOUTH EVERY DAY 90 tablet 3   No current facility-administered medications for this visit.     Review of Systems Review of Systems  Constitutional: Negative.   Respiratory: Negative.   Cardiovascular: Negative.     Blood pressure 136/82, pulse 80, resp. rate 14, height '5\' 9"'  (1.753 m), weight 149 lb (67.6 kg), last menstrual period 01/14/2018, SpO2 99 %.  Physical Exam Physical Exam  Constitutional: She is oriented to person, place, and time. She appears well-developed and well-nourished.  HENT:  Mouth/Throat: Oropharynx is clear and moist.  Eyes: Conjunctivae are normal. No scleral icterus.  Neck: Neck supple.  Cardiovascular: Normal rate, regular rhythm and  normal heart sounds.  Pulmonary/Chest: Effort normal and breath sounds normal. Right breast exhibits no inverted nipple, no mass, no nipple discharge, no skin change and no tenderness. Left breast exhibits no inverted nipple, no mass, no nipple discharge, no skin change and no tenderness.  Right lumpectomy site well healed.    Lymphadenopathy:    She has no cervical adenopathy.    She has no axillary adenopathy.       Right: No supraclavicular adenopathy present.       Left: No supraclavicular adenopathy present.  Neurological: She is alert and oriented to person, place, and time.  Skin: Skin  is warm and dry.  Psychiatric: Her behavior is normal.    Data Reviewed Bilateral diagnostic mammograms dated January 30, 2018 were reviewed.  Postsurgical changes.  BI-RADS-2.  Assessment    No evidence of recurrent DCIS.  Counseled against ongoing smoking.  Reassured patient that the fibroadenoma identified in her daughter is totally unrelated to her, and we reviewed the fact that the patient herself is BRCA negative.    Plan    The patient has been asked to return to the office in one year with a bilateral diagnostic mammogram. The patient is aware to call back for any questions or new concerns.     HPI, Physical Exam, Assessment and Plan have been scribed under the direction and in the presence of Robert Bellow, MD. Karie Fetch, RN  I have completed the exam and reviewed the above documentation for accuracy and completeness.  I agree with the above.  Haematologist has been used and any errors in dictation or transcription are unintentional.  Hervey Ard, M.D., F.A.C.S.  Traci Clements 03/08/2018, 3:20 PM

## 2018-06-25 NOTE — Progress Notes (Signed)
 PCP:  Patient, No Pcp Per   Chief Complaint  Patient presents with  . Gynecologic Exam     HPI:      Traci Clements is a 47 y.o. G2P0010 who LMP was No LMP recorded (lmp unknown). (Menstrual status: Irregular Periods)., presents today for her annual examination.  Her menses are Q1-6 months, lasting4 days.  Dysmenorrhea mild, occurring first 1-2 days of flow. She does not have intermenstrual bleeding. She is on tamoxifen and is aware of risks of Rx and DUB. She does have occas hot flashes.  Sex activity: single partner, contraception - condoms . Declines other BC. She does have vag dryness, improved with lubricants.  Last Pap: 04/25/17 Results were: ASCUS/neg HPV DNA. Repeat due this yr.  Hx of STDs: cervical dysplasia in the past.  Last mammogram: 01/30/18  Results were: normal--routine follow-up in 12 months. Followed by Dr. Byrnett, taking tamoxifen. There is a FH of breast cancer in her [facility]. Pt diagnosed with breast cancer at age 42. Pt is BRCA/BART neg/MyRisk neg 2018. There is no FH of ovarian cancer. The patient does do self-breast exams.  Tobacco use: quit.  Alcohol use: daily use No drug use.  Exercise: moderately active  She does get adequate calcium and Vitamin D in her diet.  Normal lipids 2018. Pt was Vit D deficient but is taking supp daily.   She had colonoscopy 2018 and is due again in 5 yrs due to hx of breast cancer, per pt report.  Past Medical History:  Diagnosis Date  . Anemia   . BRCA negative 07/15/2013   MyRisk neg  . Breast cancer (HCC) 06/2013   with rad tx, right breast  . 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 Esophageal reflux   . Personal history of radiation therapy 2015   RIGHT lumpectomy  . Vitamin D deficiency 03/2016    Past Surgical History:  Procedure Laterality Date  . ADENOIDECTOMY  1990  . BREAST BIOPSY Right 1990's   negative  . BREAST BIOPSY Right 2015   papillomas and DCIS  . BREAST LUMPECTOMY Right 2015   RIGHT lumpectomy  . BREAST SURGERY Right 05/14/2013   Stereo biopsy, ADH  . BREAST SURGERY Right 07-03-13   DCIS  . CESAREAN SECTION  1998  . COLONOSCOPY WITH PROPOFOL N/A 03/28/2017   Procedure: COLONOSCOPY WITH PROPOFOL;  Surgeon: Byrnett, Jeffrey W, MD;  Location: [facility] ENDOSCOPY;  Service: Endoscopy;  Laterality: N/A;  . DILATION AND CURETTAGE OF UTERUS  2006  . HAND SURGERY Left 2014  . TONSILLECTOMY  1990    Family History  Problem Relation Age of Onset  . Breast cancer Maternal Grandmother        great, 60's  . Diabetes Mellitus I Maternal Grandmother   . Hypertension Mother   . Diabetes Maternal Grandfather   . Hyperlipidemia Maternal Grandfather   . Diabetes Paternal Grandmother   . Hyperlipidemia Paternal Grandmother   . Anemia Paternal Grandmother   . COPD Paternal Grandmother   . Diabetes Mellitus I Paternal Grandmother   . Prostate cancer Paternal Uncle     Social History   Socioeconomic History  . Marital status: Married    Spouse name: Not on file  . Number of children: Not on file  . Years of education: Not on file  . Highest education level: Not on file  Occupational History  .   Not on file  Social Needs  . Financial resource strain: Not on file  . Food insecurity:    Worry: Not on file    Inability: Not on file  . Transportation needs:    Medical: Not on file    Non-medical: Not on file  Tobacco Use  . Smoking status: Former Smoker    Packs/day: 5.00    Years: 15.00    Pack years: 75.00    Types: Cigarettes  . Smokeless tobacco: Never Used  Substance and Sexual Activity  . Alcohol use: Yes    Alcohol/week: 0.0 standard drinks  . Drug use: No  . Sexual activity: Yes    Birth control/protection: Condom  Lifestyle  . Physical activity:    Days per week: Not on file    Minutes per session: Not on file  . Stress: Not on file  Relationships  .  Social connections:    Talks on phone: Not on file    Gets together: Not on file    Attends religious service: Not on file    Active member of club or organization: Not on file    Attends meetings of clubs or organizations: Not on file    Relationship status: Not on file  . Intimate partner violence:    Fear of current or ex partner: Not on file    Emotionally abused: Not on file    Physically abused: Not on file    Forced sexual activity: Not on file  Other Topics Concern  . Not on file  Social History Narrative  . Not on file    Current Meds  Medication Sig  . Multiple Vitamin (MULTIVITAMIN) capsule Take 1 capsule by mouth daily.  . tamoxifen (NOLVADEX) 20 MG tablet TAKE ONE TABLET BY MOUTH EVERY DAY     ROS:  Review of Systems  Constitutional: Negative for fatigue, fever and unexpected weight change.  Respiratory: Negative for cough, shortness of breath and wheezing.   Cardiovascular: Negative for chest pain, palpitations and leg swelling.  Gastrointestinal: Negative for blood in stool, constipation, diarrhea, nausea and vomiting.  Endocrine: Negative for cold intolerance, heat intolerance and polyuria.  Genitourinary: Positive for dyspareunia. Negative for dysuria, flank pain, frequency, genital sores, hematuria, menstrual problem, pelvic pain, urgency, vaginal bleeding, vaginal discharge and vaginal pain.  Musculoskeletal: Negative for back pain, joint swelling and myalgias.  Skin: Negative for rash.  Neurological: Negative for dizziness, syncope, light-headedness, numbness and headaches.  Hematological: Negative for adenopathy.  Psychiatric/Behavioral: Negative for agitation, confusion, sleep disturbance and suicidal ideas. The patient is not nervous/anxious.      Objective: BP 118/82   Pulse 84   Ht 5' 9" (1.753 m)   Wt 151 lb (68.5 kg)   LMP  (LMP Unknown)   BMI 22.30 kg/m    Physical Exam Constitutional:      Appearance: She is well-developed.    Genitourinary:     Vulva, vagina, uterus, right adnexa and left adnexa normal.     No vulval lesion or tenderness noted.     No vaginal discharge, erythema or tenderness.     No cervical motion tenderness or polyp.     Uterus is not enlarged or tender.     No right or left adnexal mass present.     Right adnexa not tender.     Left adnexa not tender.  Neck:     Musculoskeletal: Normal range of motion.     Thyroid: No thyromegaly.  Cardiovascular:       Rate and Rhythm: Normal rate and regular rhythm.     Heart sounds: Normal heart sounds. No murmur.  Pulmonary:     Effort: Pulmonary effort is normal.     Breath sounds: Normal breath sounds.  Chest:     Breasts:        Right: No mass, nipple discharge, skin change or tenderness.        Left: No mass, nipple discharge, skin change or tenderness.  Abdominal:     Palpations: Abdomen is soft.     Tenderness: There is no abdominal tenderness. There is no guarding.  Musculoskeletal: Normal range of motion.  Neurological:     Mental Status: She is alert and oriented to person, place, and time.     Cranial Nerves: No cranial nerve deficit.  Psychiatric:        Behavior: Behavior normal.  Vitals signs reviewed.     Assessment/Plan: Encounter for annual routine gynecological examination  Cervical cancer screening - Plan: Cytology - PAP  Screening for HPV (human papillomavirus) - Will call with results.  - Plan: Cytology - PAP  Screening for breast cancer - Pt followed by DR. Byrnett  Care related to current tamoxifen use - F/u prn DUB.  GYN counsel breast self exam, mammography screening, adequate intake of calcium and vitamin D, diet and exercise     F/U  Return in about 1 year (around 06/27/2019).  Cortnee Steinmiller B. Aryel Edelen, PA-C 06/26/2018 8:48 AM

## 2018-06-25 NOTE — Patient Instructions (Signed)
I value your feedback and entrusting us with your care. If you get a [facility] patient survey, I would appreciate you taking the time to let us know about your experience today. Thank you! 

## 2018-06-26 ENCOUNTER — Other Ambulatory Visit (HOSPITAL_COMMUNITY)
Admission: RE | Admit: 2018-06-26 | Discharge: 2018-06-26 | Disposition: A | Discharge status: Home / Self Care | Payer: Medicaid Other | Source: Ambulatory Visit | Attending: Obstetrics and Gynecology | Admitting: Obstetrics and Gynecology

## 2018-06-26 ENCOUNTER — Ambulatory Visit (INDEPENDENT_AMBULATORY_CARE_PROVIDER_SITE_OTHER): Payer: Medicaid Other | Admitting: Obstetrics and Gynecology

## 2018-06-26 ENCOUNTER — Encounter: Payer: Self-pay | Admitting: Obstetrics and Gynecology

## 2018-06-26 VITALS — BP 118/82 | HR 84 | Ht 69.0 in | Wt 151.0 lb

## 2018-06-26 DIAGNOSIS — Z1151 Encounter for screening for human papillomavirus (HPV): Secondary | ICD-10-CM

## 2018-06-26 DIAGNOSIS — Z124 Encounter for screening for malignant neoplasm of cervix: Secondary | ICD-10-CM | POA: Insufficient documentation

## 2018-06-26 DIAGNOSIS — Z1239 Encounter for other screening for malignant neoplasm of breast: Secondary | ICD-10-CM

## 2018-06-26 DIAGNOSIS — Z01419 Encounter for gynecological examination (general) (routine) without abnormal findings: Secondary | ICD-10-CM | POA: Diagnosis not present

## 2018-06-26 DIAGNOSIS — Z79810 Long term (current) use of selective estrogen receptor modulators (SERMs): Secondary | ICD-10-CM

## 2018-06-28 ENCOUNTER — Ambulatory Visit: Payer: Medicaid Other | Attending: Radiation Oncology | Admitting: Radiation Oncology

## 2018-07-01 LAB — CYTOLOGY - PAP
Diagnosis: NEGATIVE
HPV: NOT DETECTED

## 2018-07-01 NOTE — Progress Notes (Signed)
Tried again, took me straight to voicemail. This time I did not leave another vm.

## 2018-07-01 NOTE — Progress Notes (Signed)
Pls let pt know pap was neg.

## 2018-07-01 NOTE — Progress Notes (Signed)
Called and [place].

## 2018-12-24 ENCOUNTER — Encounter: Payer: Self-pay | Admitting: General Surgery

## 2019-01-28 ENCOUNTER — Other Ambulatory Visit: Payer: Self-pay

## 2019-01-28 DIAGNOSIS — D0511 Intraductal carcinoma in situ of right breast: Secondary | ICD-10-CM

## 2019-02-18 ENCOUNTER — Other Ambulatory Visit: Payer: Medicaid Other

## 2019-02-25 ENCOUNTER — Ambulatory Visit: Payer: Medicaid Other | Admitting: Surgery

## 2019-02-26 ENCOUNTER — Ambulatory Visit
Admission: RE | Admit: 2019-02-26 | Discharge: 2019-02-26 | Disposition: A | Discharge status: Home / Self Care | Payer: Medicaid Other | Source: Ambulatory Visit | Attending: Surgery | Admitting: Surgery

## 2019-02-26 DIAGNOSIS — D0511 Intraductal carcinoma in situ of right breast: Secondary | ICD-10-CM

## 2019-03-12 ENCOUNTER — Encounter: Payer: Self-pay | Admitting: Surgery

## 2019-03-12 ENCOUNTER — Other Ambulatory Visit: Payer: Self-pay

## 2019-03-12 ENCOUNTER — Ambulatory Visit: Payer: Medicaid Other | Admitting: Surgery

## 2019-03-12 VITALS — BP 114/80 | HR 78 | Temp 97.7°F | Ht 69.0 in | Wt 156.0 lb

## 2019-03-12 DIAGNOSIS — D0511 Intraductal carcinoma in situ of right breast: Secondary | ICD-10-CM | POA: Diagnosis not present

## 2019-03-12 NOTE — Patient Instructions (Addendum)
You may stop your Tamoxifen once your prescription runs out.   May have annual screening mammograms with GYN or PCP.  Follow-up with our office as needed.  Please call and ask to speak with a nurse if you develop questions or concerns.

## 2019-03-13 ENCOUNTER — Encounter: Payer: Self-pay | Admitting: Surgery

## 2019-03-13 NOTE — Progress Notes (Signed)
03/13/2019  History of Present Illness: Traci Clements is a 48 y.o. female s/p right lumpectomy for DCIA in 06/2013.  She underwent radiation and is on Tamoxifen.  She denies any new symptoms, masses, skin changes, or nipple changes.  Past Medical History: Past Medical History:  Diagnosis Date  . Anemia   . BRCA negative 07/15/2013   MyRisk neg  . Breast cancer ([place]) 06/2013   with rad tx, right breast  . 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 Esophageal reflux   . Personal history of radiation therapy 2015   RIGHT lumpectomy  . Vitamin D deficiency 03/2016     Past Surgical History: Past Surgical History:  Procedure Laterality Date  . ADENOIDECTOMY  1990  . BREAST BIOPSY Right 1990's   negative  . BREAST BIOPSY Right 2015   papillomas and DCIS  . BREAST LUMPECTOMY Right 2015   RIGHT lumpectomy  . BREAST SURGERY Right 05/14/2013   Stereo biopsy, ADH  . BREAST SURGERY Right 07-03-13   DCIS  . CESAREAN SECTION  1998  . COLONOSCOPY WITH PROPOFOL N/A 03/28/2017   Procedure: COLONOSCOPY WITH PROPOFOL;  Surgeon: Robert Bellow, MD;  Location: [facility] ENDOSCOPY;  Service: Endoscopy;  Laterality: N/A;  . DILATION AND CURETTAGE OF UTERUS  2006  . HAND SURGERY Left 2014  . TONSILLECTOMY  1990    Home Medications: Prior to Admission medications   Medication Sig Start Date End Date Taking? Authorizing Provider  Multiple Vitamin (MULTIVITAMIN) capsule Take 1 capsule by mouth daily.   Yes [provider]  tamoxifen (NOLVADEX) 20 MG tablet TAKE ONE TABLET BY MOUTH EVERY DAY 03/06/18  Yes Byrnett, Forest Gleason, MD    Allergies: Allergies  Allergen Reactions  . Codeine Nausea Only    Review of Systems: Review of Systems  Constitutional: Negative for chills and fever.  Respiratory: Negative for shortness of breath.   Cardiovascular: Negative for chest pain.   Gastrointestinal: Negative for nausea and vomiting.  Skin: Negative for rash.    Physical Exam BP 114/80   Pulse 78   Temp 97.7 F (36.5 C)   Ht '5\' 9"'$  (1.753 m)   Wt 156 lb (70.8 kg)   LMP 01/07/2019 (Approximate)   SpO2 97%   BMI 23.04 kg/m  CONSTITUTIONAL: No acute distress HEENT:  Normocephalic, atraumatic, extraocular motion intact. RESPIRATORY:  Lungs are clear, and breath sounds are equal bilaterally. Normal respiratory effort without pathologic use of accessory muscles. CARDIOVASCULAR: Heart is regular without murmurs, gallops, or rubs. BREAST:  Right breast s/p lumpectomy with scar well healed.  No palpable masses, skin changes, or nipple changes.  No right axillary or supraclavicular lymphadenopathy.  On left breast, no palpable masses, skin changes, or nipple changes.  No left axillary or supraclavicular lymphadenopathy. NEUROLOGIC:  Motor and sensation is grossly normal.  Cranial nerves are grossly intact. PSYCH:  Alert and oriented to person, place and time. Affect is normal.  Labs/Imaging: Mammogram 02/26/19: FINDINGS: The right breast lumpectomy site is stable. No suspicious calcifications, masses or areas of distortion are seen in the bilateral breasts.  Mammographic images were processed with CAD.  IMPRESSION: Stable right breast lumpectomy site. No mammographic evidence of malignancy in the bilateral breasts.  RECOMMENDATION: Screening mammogram in one year.(Code:SM-B-01Y)  Assessment and Plan: This is a 48 y.o. female s/p right breast lumpectomy  Discussed mammogram results with the patient. She is 5  years out from her surgery and has been doing well without any new issues or suspicious findings.  At this point, we can transition care back to her PCP for future yearly mammograms and exams.  However, she understands that we would always be available if any questions, concerns, or new findings.  She can stop the Tamoxifen when she's done with her current  prescription.  Face-to-face time spent with the patient and care providers was 15 minutes, with more than 50% of the time spent counseling, educating, and coordinating care of the patient.     Melvyn Neth, [facility]

## 2019-07-01 NOTE — Progress Notes (Signed)
PCP:  Patient, No Pcp Per   Chief Complaint  Patient presents with  . Gynecologic Exam     HPI:      Ms. Traci Clements is a 49 y.o. G2P0010 who LMP was Patient's last menstrual period was 04/28/2019 (exact date)., presents today for her annual examination.  Her menses are infrequent due tamoxifen, and have continued to be so since stopping it 10/20. They are light and last 3-5 days.  Dysmenorrhea mild, occurring first 1-2 days of flow. She does not have intermenstrual bleeding.   Sex activity: single partner, contraception - condoms.  She does have vag dryness, improved with lubricants.  Last Pap: 06/26/18 Results were: no abnormalities/neg HPV DNA.  Hx of STDs: cervical dysplasia in the past.  Last mammogram: 02/26/19  Results were: normal--routine follow-up in 12 months. Followed by Dr. Hampton Abbot for DCIS 2015, stopped tamoxifen 10/20. There is a FH of breast cancer in her [place]. Pt diagnosed with breast cancer at age 70. Pt is BRCA/BART neg/MyRisk neg 2018. There is no FH of ovarian cancer. The patient does do self-breast exams. Has regular breast tenderness in RT breast (s/p lumpectomy). No masses. Drinks minimal to no caffeine.  Tobacco use: quit over a yr ago Alcohol use: wknd use No drug use.  Exercise: moderately active  She does get adequate calcium and Vitamin D in her diet.  Normal lipids 2018. Pt was Vit D deficient but is taking supp daily. Due for labs today.  She had colonoscopy 2018 and is due again after 5 yrs due to hx of breast cancer, per pt report.  Past Medical History:  Diagnosis Date  . Anemia   . BRCA negative 07/15/2013   MyRisk neg  . Breast cancer ([place]) 06/2013   with rad tx, right breast  . 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 Esophageal reflux   . Personal history of radiation therapy 2015   RIGHT lumpectomy  . Vitamin D deficiency 03/2016     Past Surgical History:  Procedure Laterality Date  . ADENOIDECTOMY  1990  . BREAST BIOPSY Right 1990's   negative  . BREAST BIOPSY Right 2015   papillomas and DCIS  . BREAST LUMPECTOMY Right 2015   RIGHT lumpectomy  . BREAST SURGERY Right 05/14/2013   Stereo biopsy, ADH  . BREAST SURGERY Right 07-03-13   DCIS  . CESAREAN SECTION  1998  . COLONOSCOPY WITH PROPOFOL N/A 03/28/2017   Procedure: COLONOSCOPY WITH PROPOFOL;  Surgeon: Robert Bellow, MD;  Location: [facility] ENDOSCOPY;  Service: Endoscopy;  Laterality: N/A;  . DILATION AND CURETTAGE OF UTERUS  2006  . HAND SURGERY Left 2014  . TONSILLECTOMY  1990    Family History  Problem Relation Age of Onset  . Breast cancer Maternal Grandmother        great, 60's  . Diabetes Mellitus I Maternal Grandmother   . Hypertension Mother   . Diabetes Maternal Grandfather   . Hyperlipidemia Maternal Grandfather   . Diabetes Paternal Grandmother   . Hyperlipidemia Paternal Grandmother   . Anemia Paternal Grandmother   . COPD Paternal Grandmother   . Diabetes Mellitus I Paternal Grandmother   . Prostate cancer Paternal Uncle   . Bone cancer Paternal Uncle     Social History   Socioeconomic History  . Marital status: Married    Spouse name: Not on file  . Number of  children: Not on file  . Years of education: Not on file  . Highest education level: Not on file  Occupational History  . Not on file  Tobacco Use  . Smoking status: Former Smoker    Packs/day: 5.00    Years: 15.00    Pack years: 75.00    Types: Cigarettes  . Smokeless tobacco: Never Used  Substance and Sexual Activity  . Alcohol use: Yes    Alcohol/week: 0.0 standard drinks  . Drug use: No  . Sexual activity: Yes    Birth control/protection: Condom  Other Topics Concern  . Not on file  Social History Narrative  . Not on file   Social Determinants of Health   Financial Resource Strain:   . Difficulty of Paying Living Expenses: Not on file  Food  Insecurity:   . Worried About Charity fundraiser in the Last Year: Not on file  . Ran Out of Food in the Last Year: Not on file  Transportation Needs:   . Lack of Transportation (Medical): Not on file  . Lack of Transportation (Non-Medical): Not on file  Physical Activity:   . Days of Exercise per Week: Not on file  . Minutes of Exercise per Session: Not on file  Stress:   . Feeling of Stress : Not on file  Social Connections:   . Frequency of Communication with Friends and Family: Not on file  . Frequency of Social Gatherings with Friends and Family: Not on file  . Attends Religious Services: Not on file  . Active Member of Clubs or Organizations: Not on file  . Attends Archivist Meetings: Not on file  . Marital Status: Not on file  Intimate Partner Violence:   . Fear of Current or Ex-Partner: Not on file  . Emotionally Abused: Not on file  . Physically Abused: Not on file  . Sexually Abused: Not on file    Current Meds  Medication Sig  . Multiple Vitamin (MULTIVITAMIN) capsule Take 1 capsule by mouth daily.     ROS:  Review of Systems  Constitutional: Negative for fatigue, fever and unexpected weight change.  Respiratory: Negative for cough, shortness of breath and wheezing.   Cardiovascular: Negative for chest pain, palpitations and leg swelling.  Gastrointestinal: Negative for blood in stool, constipation, diarrhea, nausea and vomiting.  Endocrine: Negative for cold intolerance, heat intolerance and polyuria.  Genitourinary: Negative for dyspareunia, dysuria, flank pain, frequency, genital sores, hematuria, menstrual problem, pelvic pain, urgency, vaginal bleeding, vaginal discharge and vaginal pain.  Musculoskeletal: Negative for back pain, joint swelling and myalgias.  Skin: Negative for rash.  Neurological: Negative for dizziness, syncope, light-headedness, numbness and headaches.  Hematological: Negative for adenopathy.  Psychiatric/Behavioral: Positive  for dysphoric mood. Negative for agitation, confusion, sleep disturbance and suicidal ideas. The patient is not nervous/anxious.   BREAST: tenderness  Objective: BP 120/90   Ht '5\' 9"'  (1.753 m)   Wt 166 lb (75.3 kg)   LMP 04/28/2019 (Exact Date)   BMI 24.51 kg/m    Physical Exam Constitutional:      Appearance: She is well-developed.  Genitourinary:     Vulva, vagina, uterus, right adnexa and left adnexa normal.     No vulval lesion or tenderness noted.     No vaginal discharge, erythema or tenderness.     No cervical motion tenderness or polyp.     Uterus is not enlarged or tender.     No right or left adnexal mass present.  Right adnexa not tender.     Left adnexa not tender.  Neck:     Thyroid: No thyromegaly.  Cardiovascular:     Rate and Rhythm: Normal rate and regular rhythm.     Heart sounds: Normal heart sounds. No murmur.  Pulmonary:     Effort: Pulmonary effort is normal.     Breath sounds: Normal breath sounds.  Chest:     Breasts:        Right: No mass, nipple discharge, skin change or tenderness.        Left: No mass, nipple discharge, skin change or tenderness.  Abdominal:     Palpations: Abdomen is soft.     Tenderness: There is no abdominal tenderness. There is no guarding.  Musculoskeletal:        General: Normal range of motion.     Cervical back: Normal range of motion.  Neurological:     Mental Status: She is alert and oriented to person, place, and time.     Cranial Nerves: No cranial nerve deficit.  Psychiatric:        Behavior: Behavior normal.  Vitals reviewed.     Assessment/Plan: Encounter for annual routine gynecological examination  Encounter for screening mammogram for malignant neoplasm of breast; pt followed by Dr. Hampton Abbot  Blood tests for routine general physical examination - Plan: Comprehensive metabolic panel, Lipid panel  Screening cholesterol level - Plan: Lipid panel  GYN counsel breast self exam, mammography  screening, adequate intake of calcium and vitamin D, diet and exercise     F/U  Return in about 1 year (around 07/01/2020).  Willean Schurman B. Vaniah Chambers, PA-C 07/02/2019 8:38 AM

## 2019-07-01 NOTE — Patient Instructions (Signed)
I value your feedback and entrusting us with your care. If you get a [facility] patient survey, I would appreciate you taking the time to let us know about your experience today. Thank you!  As of May 15, 2019, your lab results will be released to your MyChart immediately, before I even have a chance to see them. Please give me time to review them and contact you if there are any abnormalities. Thank you for your patience.  

## 2019-07-02 ENCOUNTER — Other Ambulatory Visit: Payer: Self-pay

## 2019-07-02 ENCOUNTER — Ambulatory Visit (INDEPENDENT_AMBULATORY_CARE_PROVIDER_SITE_OTHER): Payer: Medicaid Other | Admitting: Obstetrics and Gynecology

## 2019-07-02 ENCOUNTER — Encounter: Payer: Self-pay | Admitting: Obstetrics and Gynecology

## 2019-07-02 VITALS — BP 120/90 | Ht 69.0 in | Wt 166.0 lb

## 2019-07-02 DIAGNOSIS — Z13220 Encounter for screening for lipoid disorders: Secondary | ICD-10-CM

## 2019-07-02 DIAGNOSIS — Z01419 Encounter for gynecological examination (general) (routine) without abnormal findings: Secondary | ICD-10-CM

## 2019-07-02 DIAGNOSIS — Z0000 Encounter for general adult medical examination without abnormal findings: Secondary | ICD-10-CM | POA: Diagnosis not present

## 2019-07-02 DIAGNOSIS — Z1231 Encounter for screening mammogram for malignant neoplasm of breast: Secondary | ICD-10-CM

## 2019-07-03 LAB — COMPREHENSIVE METABOLIC PANEL
ALT: 8 IU/L (ref 0–32)
AST: 20 IU/L (ref 0–40)
Albumin/Globulin Ratio: 2 (ref 1.2–2.2)
Albumin: 4.4 g/dL (ref 3.8–4.8)
Alkaline Phosphatase: 61 IU/L (ref 39–117)
BUN/Creatinine Ratio: 17 (ref 9–23)
BUN: 16 mg/dL (ref 6–24)
Bilirubin Total: <0.2 mg/dL (ref 0.0–1.2)
CO2: 22 mmol/L (ref 20–29)
Calcium: 9.2 mg/dL (ref 8.7–10.2)
Chloride: 106 mmol/L (ref 96–106)
Creatinine, Ser: 0.92 mg/dL (ref 0.57–1.00)
GFR calc Af Amer: 85 mL/min/{1.73_m2} (ref >59)
GFR calc non Af Amer: 73 mL/min/{1.73_m2} (ref >59)
Globulin, Total: 2.2 g/dL (ref 1.5–4.5)
Glucose: 81 mg/dL (ref 65–99)
Potassium: 4.6 mmol/L (ref 3.5–5.2)
Sodium: 140 mmol/L (ref 134–144)
Total Protein: 6.6 g/dL (ref 6.0–8.5)

## 2019-07-03 LAB — LIPID PANEL
Chol/HDL Ratio: 1.4 ratio (ref 0.0–4.4)
Cholesterol, Total: 186 mg/dL (ref 100–199)
HDL: 136 mg/dL (ref >39)
LDL Chol Calc (NIH): 41 mg/dL (ref 0–99)
Triglycerides: 43 mg/dL (ref 0–149)
VLDL Cholesterol Cal: 9 mg/dL (ref 5–40)

## 2019-07-03 NOTE — Progress Notes (Signed)
Called pt, no answer, could not leave voice msg due to mail box full. 

## 2019-07-03 NOTE — Progress Notes (Signed)
Pls let pt know labs are normal. Thx.

## 2020-01-13 ENCOUNTER — Other Ambulatory Visit: Payer: Self-pay | Admitting: Surgery

## 2020-01-13 DIAGNOSIS — Z1231 Encounter for screening mammogram for malignant neoplasm of breast: Secondary | ICD-10-CM

## 2020-03-03 ENCOUNTER — Ambulatory Visit
Admission: RE | Admit: 2020-03-03 | Discharge: 2020-03-03 | Disposition: A | Discharge status: Home / Self Care | Payer: Medicaid Other | Source: Ambulatory Visit | Attending: Surgery | Admitting: Surgery

## 2020-03-03 ENCOUNTER — Telehealth: Payer: Self-pay

## 2020-03-03 DIAGNOSIS — Z1231 Encounter for screening mammogram for malignant neoplasm of breast: Secondary | ICD-10-CM | POA: Diagnosis not present

## 2020-03-03 NOTE — Telephone Encounter (Signed)
Patient notified of normal mammogram. Follow up appointment scheduled with Dr.Piscoya 03/17/20.

## 2020-03-17 ENCOUNTER — Ambulatory Visit: Payer: Medicaid Other | Admitting: Surgery

## 2020-03-17 ENCOUNTER — Encounter: Payer: Self-pay | Admitting: Surgery

## 2020-03-17 ENCOUNTER — Other Ambulatory Visit: Payer: Self-pay

## 2020-03-17 VITALS — BP 130/80 | HR 92 | Temp 98.3°F | Resp 12 | Ht 69.0 in | Wt 167.4 lb

## 2020-03-17 DIAGNOSIS — D0511 Intraductal carcinoma in situ of right breast: Secondary | ICD-10-CM

## 2020-03-17 NOTE — Patient Instructions (Signed)
You will still need to do yearly mammograms. Please have these done with your Primary Care Provider.   Follow up as needed with our office, call if you have any questions or concerns.   Breast Self-Awareness Breast self-awareness means being familiar with how your breasts look and feel. It involves checking your breasts regularly and reporting any changes to your health care provider. Practicing breast self-awareness is important. Sometimes changes may not be harmful (are benign), but sometimes a change in your breasts can be a sign of a serious medical problem. It is important to learn how to do this procedure correctly so that you can catch problems early, when treatment is more likely to be successful. All women should practice breast self-awareness, including women who have had breast implants. What you need:  A mirror.  A well-lit room. How to do a breast self-exam A breast self-exam is one way to learn what is normal for your breasts and whether your breasts are changing. To do a breast self-exam: Look for changes  1. Remove all the clothing above your waist. 2. Stand in front of a mirror in a room with good lighting. 3. Put your hands on your hips. 4. Push your hands firmly downward. 5. Compare your breasts in the mirror. Look for differences between them (asymmetry), such as: ? Differences in shape. ? Differences in size. ? Puckers, dips, and bumps in one breast and not the other. 6. Look at each breast for changes in the skin, such as: ? Redness. ? Scaly areas. 7. Look for changes in your nipples, such as: ? Discharge. ? Bleeding. ? Dimpling. ? Redness. ? A change in position. Feel for changes Carefully feel your breasts for lumps and changes. It is best to do this while lying on your back on the floor, and again while sitting or standing in the tub or shower with soapy water on your skin. Feel each breast in the following way: 1. Place the arm on the side of the breast  you are examining above your head. 2. Feel your breast with the other hand. 3. Start in the nipple area and make -inch (2 cm) overlapping circles to feel your breast. Use the pads of your three middle fingers to do this. Apply light pressure, then medium pressure, then firm pressure. The light pressure will allow you to feel the tissue closest to the skin. The medium pressure will allow you to feel the tissue that is a little deeper. The firm pressure will allow you to feel the tissue close to the ribs. 4. Continue the overlapping circles, moving downward over the breast until you feel your ribs below your breast. 5. Move one finger-width toward the center of the body. Continue to use the -inch (2 cm) overlapping circles to feel your breast as you move slowly up toward your collarbone. 6. Continue the up-and-down exam using all three pressures until you reach your armpit.  Write down what you find Writing down what you find can help you remember what to discuss with your health care provider. Write down:  What is normal for each breast.  Any changes that you find in each breast, including: ? The kind of changes you find. ? Any pain or tenderness. ? Size and location of any lumps.  Where you are in your menstrual cycle, if you are still menstruating. General tips and recommendations  Examine your breasts every month.  If you are breastfeeding, the best time to examine your breasts is  after a feeding or after using a breast pump.  If you menstruate, the best time to examine your breasts is 5-7 days after your period. Breasts are generally lumpier during menstrual periods, and it may be more difficult to notice changes.  With time and practice, you will become more familiar with the variations in your breasts and more comfortable with the exam. Contact a health care provider if you:  See a change in the shape or size of your breasts or nipples.  See a change in the skin of your breast  or nipples, such as a reddened or scaly area.  Have unusual discharge from your nipples.  Find a lump or thick area that was not there before.  Have pain in your breasts.  Have any concerns related to your breast health. Summary  Breast self-awareness includes looking for physical changes in your breasts, as well as feeling for any changes within your breasts.  Breast self-awareness should be performed in front of a mirror in a well-lit room.  You should examine your breasts every month. If you menstruate, the best time to examine your breasts is 5-7 days after your menstrual period.  Let your health care provider know of any changes you notice in your breasts, including changes in size, changes on the skin, pain or tenderness, or unusual fluid from your nipples. This information is not intended to replace advice given to you by your health care provider. Make sure you discuss any questions you have with your health care provider. Document Revised: 01/08/2018 Document Reviewed: 01/08/2018 Elsevier Patient Education  [place].

## 2020-03-17 NOTE — Progress Notes (Signed)
03/17/20  Patient is s/p right breast lumpectomy for DCIS in 06/2013.  Last visit was on 03/12/2019 at which time we discussed with the patient that she could proceed with mammograms and exam with her PCP.  Due to confusion in the office, mammogram was ordered by [place] and follow up appointment was scheduled.  There will be no charge for today's visit.  The patient discussed her mammogram with her PCP and she examined her already.  No exam will be done today.  Patient may follow up with her PCP for exams and mammograms in the future.  She knows to contact us if there are any concerns or issues going forwards.  Olean Ree, MD

## 2020-07-06 NOTE — Patient Instructions (Signed)
I value your feedback and you entrusting us with your care. If you get a [facility] patient survey, I would appreciate you taking the time to let us know about your experience today. Thank you! ? ? ?

## 2020-07-06 NOTE — Progress Notes (Signed)
PCP:  Patient, No Pcp Per   Chief Complaint  Patient presents with  . Gynecologic Exam    No concerns     HPI:      Traci Clements is a 50 y.o. G2P0010 who LMP was Patient's last menstrual period was 06/19/2020 (exact date)., presents today for her annual examination.  Her menses are monthly again, lasting 5-6 days, mod flow, no BTB, mild dysmen, improved with NSAIDs. Had been infrequent due tamoxifen, stopped 10/20. Has vasomotor sx.   Sex activity: single partner, contraception - condoms.  She does have vag dryness, improved with lubricants.  Last Pap: 06/26/18 Results were: no abnormalities/neg HPV DNA.  Hx of STDs: cervical dysplasia in the past.  Last mammogram: 03/03/20 Results were: normal--routine follow-up in 12 months. 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 There is a FH of breast cancer in her [place]. Pt diagnosed with breast cancer at age 50. Pt is BRCA/BART neg/MyRisk neg 2018. There is no FH of ovarian cancer. The patient does do self-breast exams. Has regular breast tenderness in RT breast (s/p lumpectomy). No masses. Thought to be scar tissue by Dr. Hampton Abbot.  Tobacco use: quit couple yrs ago Alcohol use: none Marijuana on wknds. Exercise: moderately active  She does get adequate calcium and Vitamin D in her diet.  Normal lipids 2018/2021. Pt was Vit D deficient but is taking supp daily.   She had colonoscopy 2018 and is due again after 5 yrs due to hx of breast cancer, per pt report.  Past Medical History:  Diagnosis Date  . Anemia   . BRCA negative 07/15/2013   MyRisk neg  . Breast cancer ([place]) 06/2013   with rad tx, right breast  . 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 Esophageal reflux   . Personal history of radiation therapy 2015   RIGHT lumpectomy  . Vitamin D deficiency 03/2016    Past Surgical History:   Procedure Laterality Date  . ADENOIDECTOMY  1990  . BREAST BIOPSY Right 1990's   negative  . BREAST BIOPSY Right 2015   papillomas and DCIS  . BREAST LUMPECTOMY Right 2015   RIGHT lumpectomy  . BREAST SURGERY Right 05/14/2013   Stereo biopsy, ADH  . BREAST SURGERY Right 07-03-13   DCIS  . CESAREAN SECTION  1998  . COLONOSCOPY WITH PROPOFOL N/A 03/28/2017   Procedure: COLONOSCOPY WITH PROPOFOL;  Surgeon: Robert Bellow, MD;  Location: [facility] ENDOSCOPY;  Service: Endoscopy;  Laterality: N/A;  . DILATION AND CURETTAGE OF UTERUS  2006  . HAND SURGERY Left 2014  . TONSILLECTOMY  1990    Family History  Problem Relation Age of Onset  . Breast cancer Maternal Grandmother        great, 60's  . Diabetes Mellitus I Maternal Grandmother   . Hypertension Mother   . Diabetes Maternal Grandfather   . Hyperlipidemia Maternal Grandfather   . Diabetes Paternal Grandmother   . Hyperlipidemia Paternal Grandmother   . Anemia Paternal Grandmother   . COPD Paternal Grandmother   . Diabetes Mellitus I Paternal Grandmother   . Prostate cancer Paternal Uncle   . Bone cancer Paternal Uncle     Social History   Socioeconomic History  . Marital status: Married    Spouse name: Not on file  . Number of children: Not on file  . Years of  education: Not on file  . Highest education level: Not on file  Occupational History  . Not on file  Tobacco Use  . Smoking status: Former Smoker    Packs/day: 5.00    Years: 15.00    Pack years: 75.00    Types: Cigarettes  . Smokeless tobacco: Never Used  Vaping Use  . Vaping Use: Never used  Substance and Sexual Activity  . Alcohol use: Yes    Alcohol/week: 0.0 standard drinks  . Drug use: No  . Sexual activity: Yes    Birth control/protection: Condom  Other Topics Concern  . Not on file  Social History Narrative  . Not on file   Social Determinants of Health   Financial Resource Strain: Not on file  Food Insecurity: Not on file   Transportation Needs: Not on file  Physical Activity: Not on file  Stress: Not on file  Social Connections: Not on file  Intimate Partner Violence: Not on file    Current Meds  Medication Sig  . Multiple Vitamin (MULTIVITAMIN) capsule Take 1 capsule by mouth daily.  Marland Kitchen tiZANidine (ZANAFLEX) 4 MG tablet Take 4 mg by mouth at bedtime as needed.     ROS:  Review of Systems  Constitutional: Negative for fatigue, fever and unexpected weight change.  Respiratory: Negative for cough, shortness of breath and wheezing.   Cardiovascular: Negative for chest pain, palpitations and leg swelling.  Gastrointestinal: Negative for blood in stool, constipation, diarrhea, nausea and vomiting.  Endocrine: Negative for cold intolerance, heat intolerance and polyuria.  Genitourinary: Negative for dyspareunia, dysuria, flank pain, frequency, genital sores, hematuria, menstrual problem, pelvic pain, urgency, vaginal bleeding, vaginal discharge and vaginal pain.  Musculoskeletal: Negative for back pain, joint swelling and myalgias.  Skin: Negative for rash.  Neurological: Negative for dizziness, syncope, light-headedness, numbness and headaches.  Hematological: Negative for adenopathy.  Psychiatric/Behavioral: Negative for agitation, confusion, dysphoric mood, sleep disturbance and suicidal ideas. The patient is not nervous/anxious.   BREAST: tenderness  Objective: BP 120/90   Ht '5\' 9"'  (1.753 m)   Wt 172 lb (78 kg)   LMP 06/19/2020 (Exact Date)   BMI 25.40 kg/m    Physical Exam Constitutional:      Appearance: She is well-developed.  Genitourinary:     Vulva normal.     Right Labia: No rash, tenderness or lesions.    Left Labia: No tenderness, lesions or rash.    No vaginal discharge, erythema or tenderness.      Right Adnexa: not tender and no mass present.    Left Adnexa: not tender and no mass present.    No cervical motion tenderness, friability or polyp.     Uterus is not enlarged or  tender.  Breasts:     Right: No mass, nipple discharge, skin change or tenderness.     Left: No mass, nipple discharge, skin change or tenderness.    Neck:     Thyroid: No thyromegaly.  Cardiovascular:     Rate and Rhythm: Normal rate and regular rhythm.     Heart sounds: Normal heart sounds. No murmur heard.   Pulmonary:     Effort: Pulmonary effort is normal.     Breath sounds: Normal breath sounds.  Abdominal:     Palpations: Abdomen is soft.     Tenderness: There is no abdominal tenderness. There is no guarding or rebound.  Musculoskeletal:        General: Normal range of motion.     Cervical back:  Normal range of motion.  Lymphadenopathy:     Cervical: No cervical adenopathy.  Neurological:     General: No focal deficit present.     Mental Status: She is alert and oriented to person, place, and time.     Cranial Nerves: No cranial nerve deficit.  Skin:    General: Skin is warm and dry.  Psychiatric:        Mood and Affect: Mood normal.        Behavior: Behavior normal.        Thought Content: Thought content normal.        Judgment: Judgment normal.  Vitals reviewed.     Assessment/Plan: Encounter for annual routine gynecological examination  Encounter for screening mammogram for malignant neoplasm of breast - Plan: MM 3D SCREEN BREAST BILATERAL; pt released by Dr. Hampton Abbot. Pt to sched mmamo 9/22  Ductal carcinoma in situ (DCIS) of breast, Right - Plan: MM 3D SCREEN BREAST BILATERAL  Perimenopause--f/u prn DUB  GYN counsel breast self exam, mammography screening, adequate intake of calcium and vitamin D, diet and exercise     F/U  Return in about 1 year (around 07/07/2021).  Noura Purpura B. Kayna Suppa, PA-C 07/07/2020 9:29 AM

## 2020-07-07 ENCOUNTER — Encounter: Payer: Self-pay | Admitting: Obstetrics and Gynecology

## 2020-07-07 ENCOUNTER — Other Ambulatory Visit: Payer: Self-pay

## 2020-07-07 ENCOUNTER — Ambulatory Visit (INDEPENDENT_AMBULATORY_CARE_PROVIDER_SITE_OTHER): Payer: Medicaid Other | Admitting: Obstetrics and Gynecology

## 2020-07-07 VITALS — BP 120/90 | Ht 69.0 in | Wt 172.0 lb

## 2020-07-07 DIAGNOSIS — Z1231 Encounter for screening mammogram for malignant neoplasm of breast: Secondary | ICD-10-CM | POA: Diagnosis not present

## 2020-07-07 DIAGNOSIS — D0511 Intraductal carcinoma in situ of right breast: Secondary | ICD-10-CM | POA: Diagnosis not present

## 2020-07-07 DIAGNOSIS — Z01419 Encounter for gynecological examination (general) (routine) without abnormal findings: Secondary | ICD-10-CM

## 2020-07-07 DIAGNOSIS — N951 Menopausal and female climacteric states: Secondary | ICD-10-CM

## 2020-08-04 DIAGNOSIS — M5412 Radiculopathy, cervical region: Secondary | ICD-10-CM | POA: Insufficient documentation

## 2021-02-09 ENCOUNTER — Ambulatory Visit (INDEPENDENT_AMBULATORY_CARE_PROVIDER_SITE_OTHER): Payer: Medicaid Other | Admitting: Gastroenterology

## 2021-02-09 ENCOUNTER — Other Ambulatory Visit: Payer: Self-pay

## 2021-02-09 ENCOUNTER — Encounter: Payer: Self-pay | Admitting: Gastroenterology

## 2021-02-09 VITALS — BP 150/99 | HR 82 | Temp 98.4°F | Ht 69.0 in | Wt 178.0 lb

## 2021-02-09 DIAGNOSIS — K219 Gastro-esophageal reflux disease without esophagitis: Secondary | ICD-10-CM

## 2021-02-09 DIAGNOSIS — R1032 Left lower quadrant pain: Secondary | ICD-10-CM | POA: Diagnosis not present

## 2021-02-09 NOTE — Patient Instructions (Addendum)
Please do not eat or drink 4 hours prior to your CT Scan. Please arrive 15 minutes prior to your scan.  Food Choices for Gastroesophageal Reflux Disease, Adult When you have gastroesophageal reflux disease (GERD), the foods you eat and your eating habits are very important. Choosing the right foods can help ease your discomfort. Think about working with a food expert (dietitian) to help you make good choices. What are tips for following this plan? Reading food labels Look for foods that are low in saturated fat. Foods that may help with your symptoms include: Foods that have less than 5% of daily value (DV) of fat. Foods that have 0 grams of trans fat. Cooking Do not fry your food. Cook your food by baking, steaming, grilling, or broiling. These are all methods that do not need a lot of fat for cooking. To add flavor, try to use herbs that are low in spice and acidity. Meal planning  Choose healthy foods that are low in fat, such as: Fruits and vegetables. Whole grains. Low-fat dairy products. Lean meats, fish, and poultry. Eat small meals often instead of eating 3 large meals each day. Eat your meals slowly in a place where you are relaxed. Avoid bending over or lying down until 2-3 hours after eating. Limit high-fat foods such as fatty meats or fried foods. Limit your intake of fatty foods, such as oils, butter, and shortening. Avoid the following as told by your doctor: Foods that cause symptoms. These may be different for different people. Keep a food diary to keep track of foods that cause symptoms. Alcohol. Drinking a lot of liquid with meals. Eating meals during the 2-3 hours before bed. Lifestyle Stay at a healthy weight. Ask your doctor what weight is healthy for you. If you need to lose weight, work with your doctor to do so safely. Exercise for at least 30 minutes on 5 or more days each week, or as told by your doctor. Wear loose-fitting clothes. Do not smoke or use any  products that contain nicotine or tobacco. If you need help quitting, ask your doctor. Sleep with the head of your bed higher than your feet. Use a wedge under the mattress or blocks under the bed frame to raise the head of the bed. Chew sugar-free gum after meals. What foods should eat? Eat a healthy, well-balanced diet of fruits, vegetables, whole grains, low-fat dairy products, lean meats, fish, and poultry. Each person is different. Foods that may cause symptoms in one person may not cause any symptoms in another person. Work with your doctor to find foods that are safe for you. The items listed above may not be a complete list of what you can eat and drink. Contact a food expert for more options. What foods should I avoid? Limiting some of these foods may help in managing the symptoms of GERD. Everyone is different. Talk with a food expert or your doctor to help you find the exact foods to avoid, if any. Fruits Any fruits prepared with added fat. Any fruits that cause symptoms. For some people, this may include citrus fruits, such as oranges, grapefruit, pineapple, and lemons. Vegetables Deep-fried vegetables. [place] fries. Any vegetables prepared with added fat. Any vegetables that cause symptoms. For some people, this may include tomatoes and tomato products, chili peppers, onions and garlic, and horseradish. Grains Pastries or quick breads with added fat. Meats and other proteins High-fat meats, such as fatty beef or pork, hot dogs, ribs, ham, sausage, salami,  and bacon. Fried meat or protein, including fried fish and fried chicken. Nuts and nut butters, in large amounts. Dairy Whole milk and chocolate milk. Sour cream. Cream. Ice cream. Cream cheese. Milkshakes. Fats and oils Butter. Margarine. Shortening. Ghee. Beverages Coffee and tea, with or without caffeine. Carbonated beverages. Sodas. Energy drinks. Fruit juice made with acidic fruits, such as orange or grapefruit. Tomato juice.  Alcoholic drinks. Sweets and desserts Chocolate and cocoa. Donuts. Seasonings and condiments Pepper. Peppermint and spearmint. Added salt. Any condiments, herbs, or seasonings that cause symptoms. For some people, this may include curry, hot sauce, or vinegar-based salad dressings. The items listed above may not be a complete list of what you should not eat and drink. Contact a food expert for more options. Questions to ask your doctor Diet and lifestyle changes are often the first steps that are taken to manage symptoms of GERD. If diet and lifestyle changes do not help, talk with your doctor about taking medicines. Where to find more information International Foundation for Gastrointestinal Disorders: aboutgerd.org Summary When you have GERD, food and lifestyle choices are very important in easing your symptoms. Eat small meals often instead of 3 large meals a day. Eat your meals slowly and in a place where you are relaxed. Avoid bending over or lying down until 2-3 hours after eating. Limit high-fat foods such as fatty meats or fried foods. This information is not intended to replace advice given to you by your health care provider. Make sure you discuss any questions you have with your health care provider. Document Revised: 12/01/2019 Document Reviewed: 12/01/2019 Elsevier Patient Education  [place].

## 2021-02-09 NOTE — Progress Notes (Signed)
Jonathon Bellows MD, MRCP(U.K) [address]  [place]  [place], [place] [address]  Main: [phone]  Fax: [phone]   Gastroenterology Consultation  Referring Provider:     Marguerita Merles, MD Primary Care Physician:  Marguerita Merles, MD Primary Gastroenterologist:  Dr. Jonathon Bellows  Reason for Consultation:     GERD and left lower quadrant pain        HPI:   Traci Clements is a 50 y.o. y/o female referred for consultation & management  by Dr. Lennox Grumbles, Connye Burkitt, MD.    She states that she has had acid reflux for over a few months.  Gained weight recently.  She complains of heartburn/chest discomfort that occurs in the middle of the night when she lays flat and better when she sits up.  She has at least 1 episode a week.  She was prescribed Prilosec which she took after her meals as needed which did help but the benefit did not stay stay sustained.  Denies any other symptoms.  Has a dinner and at 9 or 10 PM and goes to bed 11 PM.  She is a very active person works out a few times a week and has no complaints of chest pain during the process.  She complains of left lower quadrant pain for over a year sharp in nature nonradiating worse when she takes a deep breath or applies pressure.  Not relieved by bowel movement.  She has a bowel movement 1-2 times a day soft in nature with no change in bowel movements recently.  No rectal bleeding.  No other complaints.  03/28/2017: Colonoscopy: Normal repeat colonoscopy in 10 years suggested   Past Medical History:  Diagnosis Date  . Anemia   . BRCA negative 07/15/2013   MyRisk neg  . Breast cancer ([place]) 06/2013   with rad tx, right breast  . 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 Esophageal reflux   . Personal history of radiation therapy 2015   RIGHT lumpectomy  . Vitamin D deficiency 03/2016    Past Surgical History:  Procedure Laterality  Date  . ADENOIDECTOMY  1990  . BREAST BIOPSY Right 1990's   negative  . BREAST BIOPSY Right 2015   papillomas and DCIS  . BREAST LUMPECTOMY Right 2015   RIGHT lumpectomy  . BREAST SURGERY Right 05/14/2013   Stereo biopsy, ADH  . BREAST SURGERY Right 07-03-13   DCIS  . CESAREAN SECTION  1998  . COLONOSCOPY WITH PROPOFOL N/A 03/28/2017   Procedure: COLONOSCOPY WITH PROPOFOL;  Surgeon: Robert Bellow, MD;  Location: [facility] ENDOSCOPY;  Service: Endoscopy;  Laterality: N/A;  . DILATION AND CURETTAGE OF UTERUS  2006  . HAND SURGERY Left 2014  . TONSILLECTOMY  1990    Prior to Admission medications   Medication Sig Start Date End Date Taking? Authorizing Provider  Multiple Vitamin (MULTIVITAMIN) capsule Take 1 capsule by mouth daily.   Yes [provider]  omeprazole (PRILOSEC) 40 MG capsule Take 40 mg by mouth daily. Patient not taking: Reported on 02/09/2021 02/08/21   [provider]    Family History  Problem Relation Age of Onset  . Breast cancer Maternal Grandmother        great, 60's  . Diabetes Mellitus I Maternal Grandmother   . Hypertension Mother   . Diabetes Maternal Grandfather   . Hyperlipidemia Maternal Grandfather   .  Diabetes Paternal Grandmother   . Hyperlipidemia Paternal Grandmother   . Anemia Paternal Grandmother   . COPD Paternal Grandmother   . Diabetes Mellitus I Paternal Grandmother   . Prostate cancer Paternal Uncle   . Bone cancer Paternal Uncle      Social History   Tobacco Use  . Smoking status: Former    Packs/day: 5.00    Years: 15.00    Pack years: 75.00    Types: Cigarettes  . Smokeless tobacco: Never  Vaping Use  . Vaping Use: Never used  Substance Use Topics  . Alcohol use: Yes    Alcohol/week: 0.0 standard drinks  . Drug use: No    Allergies as of 02/09/2021 - Review Complete 02/09/2021  Allergen Reaction Noted  . Codeine Nausea Only 06/12/2013    Review of Systems:    All systems reviewed and negative  except where noted in HPI.   Physical Exam:  BP (!) 150/99   Pulse 82   Temp 98.4 F (36.9 C) (Oral)   Ht _0  (1.753 m)   Wt 178 lb (80.7 kg)   BMI 26.29 kg/m  No LMP recorded. Psych:  Alert and cooperative. Normal mood and affect. General:   Alert,  Well-developed, well-nourished, pleasant and cooperative in NAD Head:  Normocephalic and atraumatic. Eyes:  Sclera clear, no icterus.   Conjunctiva pink. Ears:  Normal auditory acuity. Lungs:  Respirations even and unlabored.  Clear throughout to auscultation.   No wheezes, crackles, or rhonchi. No acute distress. Heart:  Regular rate and rhythm; no murmurs, clicks, rubs, or gallops. Abdomen:  Normal bowel sounds.  No bruits.  Soft, non-tender and non-distended without masses, hepatosplenomegaly or hernias noted.  No guarding or rebound tenderness.    Neurologic:  Alert and oriented x3;  grossly normal neurologically. Psych:  Alert and cooperative. Normal mood and affect.  Imaging Studies: No results found.  Assessment and Plan:   Traci Clements is a 50 y.o. y/o female has been referred for GERD and left lower quadrant pain.  Etiology of left lower quadrant pain is unclear.  She says she has a history of breast cancer.  Hence we will proceed with a CAT scan of the abdomen to evaluate further.  I have counseled her about lifestyle changes for acid reflux including use of a wedge pillow and timing of her meals.  Discussed about a trial of omeprazole for 6 weeks taking it 30 minutes before breakfast on an empty stomach at 40 mg/day dose.  In 6 weeks time if she is doing well I will further reduce the dose of her PPI and eventually try to discontinue it or change her to Pepcid if she is compliant with lifestyle changes.  Follow up in 8 weeks  Dr Jonathon Bellows MD,MRCP(U.K)

## 2021-02-24 ENCOUNTER — Ambulatory Visit
Admission: RE | Admit: 2021-02-24 | Discharge: 2021-02-24 | Disposition: A | Discharge status: Home / Self Care | Payer: Medicaid Other | Source: Ambulatory Visit | Attending: Gastroenterology | Admitting: Gastroenterology

## 2021-02-24 DIAGNOSIS — R1032 Left lower quadrant pain: Secondary | ICD-10-CM

## 2021-03-02 ENCOUNTER — Other Ambulatory Visit: Payer: Self-pay

## 2021-03-02 ENCOUNTER — Ambulatory Visit
Admission: RE | Admit: 2021-03-02 | Discharge: 2021-03-02 | Disposition: A | Discharge status: Home / Self Care | Payer: Medicaid Other | Source: Ambulatory Visit | Attending: Gastroenterology | Admitting: Gastroenterology

## 2021-03-02 DIAGNOSIS — R1032 Left lower quadrant pain: Secondary | ICD-10-CM | POA: Insufficient documentation

## 2021-03-02 MED ORDER — IOHEXOL 350 MG/ML SOLN
80.0000 mL | Freq: Once | INTRAVENOUS | Status: AC | PRN
  Administered 2021-03-02: 80 mL via INTRAVENOUS

## 2021-03-03 NOTE — Progress Notes (Signed)
No abnormality seen

## 2021-03-04 ENCOUNTER — Telehealth: Payer: Self-pay

## 2021-03-04 NOTE — Telephone Encounter (Signed)
-----   Message from Jonathon Bellows, MD sent at 03/03/2021  8:20 AM EDT ----- No abnormality seen

## 2021-03-04 NOTE — Telephone Encounter (Signed)
Patient's phone is not set-up to leave a voicemail. Therefore, I will send her a letter letting her know that her CT Scan is normal.

## 2021-03-09 ENCOUNTER — Ambulatory Visit
Admission: RE | Admit: 2021-03-09 | Discharge: 2021-03-09 | Disposition: A | Discharge status: Home / Self Care | Payer: Medicaid Other | Source: Ambulatory Visit | Attending: Obstetrics and Gynecology | Admitting: Obstetrics and Gynecology

## 2021-03-09 ENCOUNTER — Other Ambulatory Visit: Payer: Self-pay

## 2021-03-09 DIAGNOSIS — Z1231 Encounter for screening mammogram for malignant neoplasm of breast: Secondary | ICD-10-CM | POA: Insufficient documentation

## 2021-03-09 DIAGNOSIS — D0511 Intraductal carcinoma in situ of right breast: Secondary | ICD-10-CM | POA: Insufficient documentation

## 2021-04-13 ENCOUNTER — Encounter: Payer: Self-pay | Admitting: Gastroenterology

## 2021-04-13 ENCOUNTER — Ambulatory Visit: Payer: Medicaid Other | Admitting: Gastroenterology

## 2021-06-08 ENCOUNTER — Other Ambulatory Visit: Payer: Self-pay

## 2021-06-08 ENCOUNTER — Encounter: Payer: Self-pay | Admitting: Gastroenterology

## 2021-06-08 ENCOUNTER — Ambulatory Visit (INDEPENDENT_AMBULATORY_CARE_PROVIDER_SITE_OTHER): Payer: Medicaid Other | Admitting: Gastroenterology

## 2021-06-08 VITALS — BP 149/84 | HR 76 | Temp 98.6°F | Ht 69.0 in | Wt 179.2 lb

## 2021-06-08 DIAGNOSIS — K219 Gastro-esophageal reflux disease without esophagitis: Secondary | ICD-10-CM | POA: Diagnosis not present

## 2021-06-08 DIAGNOSIS — R1032 Left lower quadrant pain: Secondary | ICD-10-CM

## 2021-06-08 MED ORDER — DICYCLOMINE HCL 10 MG PO CAPS: 10.0000 mg | ORAL_CAPSULE | Freq: Four times a day (QID) | ORAL | 0 refills | Status: AC | PRN

## 2021-06-08 MED ORDER — OMEPRAZOLE 20 MG PO CPDR: 20.0000 mg | DELAYED_RELEASE_CAPSULE | Freq: Every day | ORAL | 3 refills | Status: DC

## 2021-06-08 NOTE — Patient Instructions (Signed)
Please take Prilosec 20 MG daily ad Bentyl 10 MG four times a day as needed.

## 2021-06-08 NOTE — Progress Notes (Signed)
Jonathon Bellows MD, MRCP(U.K) [address]  [place]  [place], [place] [address]  Main: [phone]  Fax: [phone]   Primary Care Physician: Marguerita Merles, MD  Primary Gastroenterologist:  Dr. Jonathon Bellows   Chief complaint follow-up for abdominal pain   HPI: Traci Clements is a 51 y.o. female   Summary of history :  Initially referred and seen on 02/09/2021 for GERD and left lower quadrant pain.  She had gained weight recently and complaining of heartburn chest discomfort when she lays flat and better when she sat up.  Had at least 1 episode per week.  She was taking her Prilosec after her meals rather than before.  In addition she was having left lower quadrant pain for over a year sharp in nature nonradiating not relieved by bowel movement and having regular bowel movements daily.  Colonoscopy was normal in 2018.  Interval history   02/09/2021-06/08/2021  03/02/2021: CT scan of the abdomen and pelvis showed no evidence of diverticulitis or acute findings.  Adenomyosis of the uterus possibly seen.  Since her last visit she has been adherent to lifestyle changes and has had no acid reflux symptoms but she has had nonspecific symptoms of pain in her chest wall back when she sneezes when she coughs some occasional pain when she eats some lower abdominal discomfort occasionally but having regular bowel movements.  Current Outpatient Medications  Medication Sig Dispense Refill   Multiple Vitamin (MULTIVITAMIN) capsule Take 1 capsule by mouth daily.     omeprazole (PRILOSEC) 40 MG capsule Take 40 mg by mouth daily.     No current facility-administered medications for this visit.    Allergies as of 06/08/2021 - Review Complete 06/08/2021  Allergen Reaction Noted   Codeine Nausea Only 06/12/2013    ROS:  General: Negative for anorexia, weight loss, fever, chills, fatigue, weakness. ENT: Negative for hoarseness, difficulty swallowing , nasal congestion. CV: Negative for  chest pain, angina, palpitations, dyspnea on exertion, peripheral edema.  Respiratory: Negative for dyspnea at rest, dyspnea on exertion, cough, sputum, wheezing.  GI: See history of present illness. GU:  Negative for dysuria, hematuria, urinary incontinence, urinary frequency, nocturnal urination.  Endo: Negative for unusual weight change.    Physical Examination:   BP (!) 149/84 (BP Location: Left Arm, Patient Position: Sitting, Cuff Size: Normal)    Pulse 76    Temp 98.6 F (37 C) (Oral)    Ht 5\' 9"  (1.753 m)    Wt 179 lb 3.2 oz (81.3 kg)    BMI 26.46 kg/m   General: Well-nourished, well-developed in no acute distress.  Eyes: No icterus. Conjunctivae pink. Neuro: Alert and oriented x 3.  Grossly intact. Skin: Warm and dry, no jaundice.   Psych: Alert and cooperative, normal mood and affect.   Imaging Studies: No results found.  Assessment and Plan:   Traci Clements is a 51 y.o. y/o female here to follow-up for GERD and left lower quadrant pain.  CT scan of the abdomen showed no gross abnormality.  GERD symptoms have completely resolved after taking her Prilosec before breakfast.  Presently has nonspecific symptoms of chest wall back discomfort when she sneezes and coughs and mild dyspeptic symptoms.  I explained to her that it is very less likely related to her GI tract if she has discomfort when she coughs and sneezes.  In terms of her mild lower discomfort of the abdomen nothing was seen on the CAT scan we could give  her a trial of dicyclomine.  If it does not work in 6 to 8 weeks and she would like to get evaluated further we could consider endoscopy and colonoscopy although I would suspect it would be low yield.  Explained to her that we can discuss this further.  I would also plan to decrease her Prilosec to 20 mg a day today and if she is doing well at her next visit we will change her to Pepcid.    Dr Jonathon Bellows  MD,MRCP [facility]) Follow up in 3 months

## 2021-06-08 NOTE — Addendum Note (Signed)
Addended by: Wayna Chalet on: 06/08/2021 10:13 AM   Modules accepted: Orders

## 2021-09-07 ENCOUNTER — Encounter: Payer: Self-pay | Admitting: Gastroenterology

## 2021-09-07 ENCOUNTER — Other Ambulatory Visit: Payer: Self-pay

## 2021-09-07 ENCOUNTER — Ambulatory Visit (INDEPENDENT_AMBULATORY_CARE_PROVIDER_SITE_OTHER): Payer: Medicaid Other | Admitting: Gastroenterology

## 2021-09-07 VITALS — BP 144/98 | HR 98 | Temp 99.1°F

## 2021-09-07 DIAGNOSIS — K219 Gastro-esophageal reflux disease without esophagitis: Secondary | ICD-10-CM

## 2021-09-07 DIAGNOSIS — R1013 Epigastric pain: Secondary | ICD-10-CM | POA: Diagnosis not present

## 2021-09-07 DIAGNOSIS — R1032 Left lower quadrant pain: Secondary | ICD-10-CM

## 2021-09-07 DIAGNOSIS — R1084 Generalized abdominal pain: Secondary | ICD-10-CM

## 2021-09-07 MED ORDER — OMEPRAZOLE 20 MG PO CPDR: 20.0000 mg | DELAYED_RELEASE_CAPSULE | Freq: Every day | ORAL | 3 refills | Status: AC

## 2021-09-07 MED ORDER — NA SULFATE-K SULFATE-MG SULF 17.5-3.13-1.6 GM/177ML PO SOLN: 1.0000 | Freq: Once | ORAL | 0 refills | Status: AC

## 2021-09-07 NOTE — Progress Notes (Signed)
?  ?Jonathon Bellows MD, MRCP(U.K) ?[address]  ?[address]  ?[place], [place] [address]  ?Main: [phone]  ?Fax: [phone] ? ? ?Primary Care Physician: Marguerita Merles, MD ? ?Primary Gastroenterologist:  Dr. Jonathon Bellows  ? ?Chief Complaint  ?Patient presents with  ? Follow-up  ?  GERD  ? ? ?HPI: Traci Clements is a 51 y.o. female ? ? ? ?Summary of history : ? ?Initially referred and seen on 02/09/2021 for GERD. Had gained weight m h/o heartfurn  ? ?in the middle of the night when she lays flat and better when she sits up.  She has at least 1 episode a week.  She was prescribed Prilosec which she took after her meals as needed which did help but the benefit did not stay stay sustained.  Denies any other symptoms.  Has a dinner and at 9 or 10 PM and goes to bed 11 PM.  She is a very active person works out a few times a week and has no complaints of chest pain during the process.  She complains of left lower quadrant pain for over a year sharp in nature nonradiating worse when she takes a deep breath or applies pressure.  Not relieved by bowel movement.  She has a bowel movement 1-2 times a day soft in nature with no change in bowel movements recently.  No rectal bleeding.  No other complaints. ?  ?03/28/2017: Colonoscopy: Normal repeat colonoscopy in 10 years suggested ?  ?Interval history   02/09/2021-09/07/2021 ? ?02/10/2021: Ct abdomen : nil acute ?She states that initially she was doing well but the pain has become persistent in the left lower part of her abdomen and left upper part of her abdomen no clear aggravating or relieving factors.  Denies any constipation denies any improvement of the pain with a bowel movement.  Denies any NSAID use.  Denies any excess consumption of artificial sugars or sweeteners. ?Denies any unintentional weight loss rather has gained weight ?Current Outpatient Medications  ?Medication Sig Dispense Refill  ? Multiple Vitamin (MULTIVITAMIN) capsule Take 1 capsule by mouth daily.    ?  omeprazole (PRILOSEC) 20 MG capsule Take 1 capsule (20 mg total) by mouth daily. 90 capsule 3  ? dicyclomine (BENTYL) 10 MG capsule Take 1 capsule (10 mg total) by mouth 4 (four) times daily as needed for spasms. (Patient not taking: Reported on 09/07/2021) 90 capsule 0  ? ?No current facility-administered medications for this visit.  ? ? ?Allergies as of 09/07/2021 - Review Complete 09/07/2021  ?Allergen Reaction Noted  ? Codeine Nausea Only 06/12/2013  ? ? ?ROS: ? ?General: Negative for anorexia, weight loss, fever, chills, fatigue, weakness. ?ENT: Negative for hoarseness, difficulty swallowing , nasal congestion. ?CV: Negative for chest pain, angina, palpitations, dyspnea on exertion, peripheral edema.  ?Respiratory: Negative for dyspnea at rest, dyspnea on exertion, cough, sputum, wheezing.  ?GI: See history of present illness. ?GU:  Negative for dysuria, hematuria, urinary incontinence, urinary frequency, nocturnal urination.  ?Endo: Negative for unusual weight change.  ?  ?Physical Examination: ? ? BP (!) 144/98 (BP Location: Left Arm, Patient Position: Sitting)   Pulse 98   Temp 99.1 ?F (37.3 ?C) (Oral)  ? ?General: Well-nourished, well-developed in no acute distress.  ?Eyes: No icterus. Conjunctivae pink. ?Abdomen: Bowel sounds are normal, nontender, nondistended, no hepatosplenomegaly or masses, no abdominal bruits or hernia , no rebound or guarding.   ?Extremities: No lower extremity edema. No clubbing or deformities. ?Neuro: Alert and  oriented x 3.  Grossly intact. ?Skin: Warm and dry, no jaundice.   ?Psych: Alert and cooperative, normal mood and affect. ? ? ?Imaging Studies: ?No results found. ? ?Assessment and Plan:  ? ?SUZZANNE Clements is a 51 y.o. y/o femalehere to follow up for GERD.  Commenced on 40 mg of Prilosec at last visit.  She did not take it on regular basis to get on and off.  Did not see any marked improvement when she did take it continuously.  Presently has continuous pain of the left  side of her abdomen does not fit any condition clearly could indicate functional dyspepsia.  Her last colonoscopy was over 5 years but never had an upper endoscopy hence will proceed with both.  Trial of IBgard.  Advised to continue PPI and take it on a daily basis till next office visit.  Suggested a low FODMAP diet and I will see her back in 3 months. ? ? ? ? ?I have discussed alternative options, risks & benefits,  which include, but are not limited to, bleeding, infection, perforation,respiratory complication & drug reaction.  The patient agrees with this plan & written consent will be obtained.   ? ? ? ?Dr Jonathon Bellows  MD,MRCP [facility]) ?Follow up in 3 months  ?

## 2021-09-14 ENCOUNTER — Other Ambulatory Visit: Payer: Self-pay

## 2021-09-14 ENCOUNTER — Encounter
Admission: RE | Disposition: A | Discharge status: Home / Self Care | Payer: Self-pay | Source: Home / Self Care | Attending: Gastroenterology

## 2021-09-14 ENCOUNTER — Ambulatory Visit: Payer: Medicaid Other | Admitting: Certified Registered"

## 2021-09-14 ENCOUNTER — Encounter: Payer: Self-pay | Admitting: Gastroenterology

## 2021-09-14 ENCOUNTER — Ambulatory Visit
Admission: RE | Admit: 2021-09-14 | Discharge: 2021-09-14 | Disposition: A | Discharge status: Home / Self Care | Payer: Medicaid Other | Attending: Gastroenterology | Admitting: Gastroenterology

## 2021-09-14 DIAGNOSIS — D124 Benign neoplasm of descending colon: Secondary | ICD-10-CM | POA: Diagnosis not present

## 2021-09-14 DIAGNOSIS — K219 Gastro-esophageal reflux disease without esophagitis: Secondary | ICD-10-CM | POA: Insufficient documentation

## 2021-09-14 DIAGNOSIS — K2950 Unspecified chronic gastritis without bleeding: Secondary | ICD-10-CM | POA: Insufficient documentation

## 2021-09-14 DIAGNOSIS — R1084 Generalized abdominal pain: Secondary | ICD-10-CM

## 2021-09-14 DIAGNOSIS — Z87891 Personal history of nicotine dependence: Secondary | ICD-10-CM | POA: Insufficient documentation

## 2021-09-14 DIAGNOSIS — K635 Polyp of colon: Secondary | ICD-10-CM

## 2021-09-14 DIAGNOSIS — Z923 Personal history of irradiation: Secondary | ICD-10-CM | POA: Diagnosis not present

## 2021-09-14 DIAGNOSIS — Z86000 Personal history of in-situ neoplasm of breast: Secondary | ICD-10-CM | POA: Diagnosis not present

## 2021-09-14 DIAGNOSIS — R1013 Epigastric pain: Secondary | ICD-10-CM | POA: Insufficient documentation

## 2021-09-14 DIAGNOSIS — D122 Benign neoplasm of ascending colon: Secondary | ICD-10-CM | POA: Insufficient documentation

## 2021-09-14 HISTORY — PX: COLONOSCOPY WITH PROPOFOL: SHX5780

## 2021-09-14 HISTORY — PX: ESOPHAGOGASTRODUODENOSCOPY (EGD) WITH PROPOFOL: SHX5813

## 2021-09-14 LAB — POCT PREGNANCY, URINE: Preg Test, Ur: NEGATIVE

## 2021-09-14 SURGERY — ESOPHAGOGASTRODUODENOSCOPY (EGD) WITH PROPOFOL
Anesthesia: General

## 2021-09-14 MED ORDER — LIDOCAINE HCL (CARDIAC) PF 100 MG/5ML IV SOSY
PREFILLED_SYRINGE | INTRAVENOUS | Status: DC | PRN
  Administered 2021-09-14: 100 mg via INTRAVENOUS

## 2021-09-14 MED ORDER — SODIUM CHLORIDE 0.9 % IV SOLN: INTRAVENOUS | Status: DC

## 2021-09-14 MED ORDER — PROPOFOL 10 MG/ML IV BOLUS
INTRAVENOUS | Status: DC | PRN
  Administered 2021-09-14: 100 mg via INTRAVENOUS
  Administered 2021-09-14: 160 ug/kg/min via INTRAVENOUS

## 2021-09-14 NOTE — Op Note (Signed)
[facility] ?Gastroenterology ?Patient Name: Traci Clements ?Procedure Date: 09/14/2021 11:09 AM ?MRN: [id number] ?Account #: [id number] ?Date of Birth: 06/14/1970 ?Admit Type: Outpatient ?Age: 51 ?Room: [facility] ENDO ROOM 3 ?Gender: Female ?Note Status: Finalized ?Instrument Name: Upper Endoscope [id number] ?Procedure:             Upper GI endoscopy ?Indications:           Dyspepsia ?Providers:             Jonathon Bellows MD, MD ?Referring MD:          Marguerita Merles, MD (Referring MD) ?Medicines:             Monitored Anesthesia Care ?Complications:         No immediate complications. ?Procedure:             Pre-Anesthesia Assessment: ?                       - Prior to the procedure, a History and Physical was  ?                       performed, and patient medications, allergies and  ?                       sensitivities were reviewed. The patient's tolerance  ?                       of previous anesthesia was reviewed. ?                       - The risks and benefits of the procedure and the  ?                       sedation options and risks were discussed with the  ?                       patient. All questions were answered and informed  ?                       consent was obtained. ?                       - ASA Grade Assessment: II - A patient with mild  ?                       systemic disease. ?                       After obtaining informed consent, the endoscope was  ?                       passed under direct vision. Throughout the procedure,  ?                       the patient's blood pressure, pulse, and oxygen  ?                       saturations were monitored continuously. The Endoscope  ?                       was introduced  through the mouth, and advanced to the  ?                       third part of duodenum. The upper GI endoscopy was  ?                       accomplished with ease. The patient tolerated the  ?                       procedure well. ?Findings: ?     The esophagus was  normal. ?     The examined duodenum was normal. ?     The cardia and gastric fundus were normal on retroflexion. ?     The entire examined stomach was normal. Biopsies were taken with a cold  ?     forceps for histology. ?Impression:            - Normal esophagus. ?                       - Normal examined duodenum. ?                       - Normal stomach. Biopsied. ?Recommendation:        - Await pathology results. ?                       - Perform a colonoscopy today. ?Procedure Code(s):     --- Professional --- ?                       [id number], Esophagogastroduodenoscopy, flexible,  ?                       transoral; with biopsy, single or multiple ?Diagnosis Code(s):     --- Professional --- ?                       R10.13, Epigastric pain ?CPT copyright 2019 American Medical Association. All rights reserved. ?The codes documented in this report are preliminary and upon coder review may  ?be revised to meet current compliance requirements. ?Jonathon Bellows, MD ?Jonathon Bellows MD, MD ?09/14/2021 12:38:55 PM ?This report has been signed electronically. ?Number of Addenda: 0 ?Note Initiated On: 09/14/2021 11:09 AM ?Estimated Blood Loss:  Estimated blood loss: none. ?     [facility] ?

## 2021-09-14 NOTE — Transfer of Care (Signed)
Immediate Anesthesia Transfer of Care Note ? ?Patient: Traci Clements ? ?Procedure(s) Performed: ESOPHAGOGASTRODUODENOSCOPY (EGD) WITH PROPOFOL ?COLONOSCOPY WITH PROPOFOL ? ?Patient Location: PACU ? ?Anesthesia Type:General ? ?Level of Consciousness: drowsy ? ?Airway & Oxygen Therapy: Patient Spontanous Breathing ? ?Post-op Assessment: Report given to RN and Post -op Vital signs reviewed and stable ? ?Post vital signs: Reviewed and stable ? ?Last Vitals:  ?Vitals Value Taken Time  ?BP 126/79 09/14/21 1256  ?Temp 35.9 ?C 09/14/21 1256  ?Pulse 74 09/14/21 1258  ?Resp 20 09/14/21 1258  ?SpO2 100 % 09/14/21 1258  ?Vitals shown include unvalidated device data. ? ?Last Pain:  ?Vitals:  ? 09/14/21 1154  ?TempSrc: Temporal  ?PainSc: 0-No pain  ?   ? ?  ? ?Complications: No notable events documented. ?

## 2021-09-14 NOTE — H&P (Signed)
? ? ? ?Jonathon Bellows, MD ?[address], [place], [place], [place], [address] ?[address], [place], [place], [place], [address] ?Phone: [phone]  ?Fax: [phone] ? ?Primary Care Physician:  Marguerita Merles, MD ? ? ?Pre-Procedure History & Physical: ?HPI:  Traci Clements is a 51 y.o. female is here for an endoscopy and colonoscopy  ?  ?Past Medical History:  ?Diagnosis Date  ? Anemia   ? BRCA negative 07/15/2013  ? MyRisk neg  ? Breast cancer ([place]) 06/2013  ? with rad tx, right breast  ? Cancer ([place]) 06/2013  ? 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 Esophageal reflux   ? Personal history of radiation therapy 2015  ? RIGHT lumpectomy  ? Vitamin D deficiency 03/2016  ? ? ?Past Surgical History:  ?Procedure Laterality Date  ? ADENOIDECTOMY  1990  ? BREAST BIOPSY Right 1990's  ? negative  ? BREAST BIOPSY Right 2015  ? papillomas and DCIS  ? BREAST LUMPECTOMY Right 2015  ? RIGHT lumpectomy  ? BREAST SURGERY Right 05/14/2013  ? Stereo biopsy, ADH  ? BREAST SURGERY Right 07-03-13  ? DCIS  ? [place]  ? COLONOSCOPY WITH PROPOFOL N/A 03/28/2017  ? Procedure: COLONOSCOPY WITH PROPOFOL;  Surgeon: Robert Bellow, MD;  Location: [facility] ENDOSCOPY;  Service: Endoscopy;  Laterality: N/A;  ? [place] OF UTERUS  2006  ? HAND SURGERY Left 2014  ? TONSILLECTOMY  1990  ? ? ?Prior to Admission medications   ?Medication Sig Start Date End Date Taking? Authorizing Provider  ?omeprazole (PRILOSEC) 20 MG capsule Take 1 capsule (20 mg total) by mouth daily. 09/07/21  Yes Jonathon Bellows, MD  ?dicyclomine (BENTYL) 10 MG capsule Take 1 capsule (10 mg total) by mouth 4 (four) times daily as needed for spasms. ?Patient not taking: Reported on 09/07/2021 06/08/21   Jonathon Bellows, MD  ?Multiple Vitamin (MULTIVITAMIN) capsule Take 1 capsule by mouth daily.    [provider]  ? ? ?Allergies as of 09/07/2021 - Review Complete 09/07/2021   ?Allergen Reaction Noted  ? Codeine Nausea Only 06/12/2013  ? ? ?Family History  ?Problem Relation Age of Onset  ? Breast cancer Maternal Grandmother   ?     great, 60's  ? Diabetes Mellitus I Maternal Grandmother   ? Hypertension Mother   ? Diabetes Maternal Grandfather   ? Hyperlipidemia Maternal Grandfather   ? Diabetes Paternal Grandmother   ? Hyperlipidemia Paternal Grandmother   ? Anemia Paternal Grandmother   ? COPD Paternal Grandmother   ? Diabetes Mellitus I Paternal Grandmother   ? Prostate cancer Paternal Uncle   ? Bone cancer Paternal Uncle   ? ? ?Social History  ? ?Socioeconomic History  ? Marital status: Married  ?  Spouse name: Not on file  ? Number of children: Not on file  ? Years of education: Not on file  ? Highest education level: Not on file  ?Occupational History  ? Not on file  ?Tobacco Use  ? Smoking status: Former  ?  Packs/day: 5.00  ?  Years: 15.00  ?  Pack years: 75.00  ?  Types: Cigarettes  ? Smokeless tobacco: Never  ?Vaping Use  ? Vaping Use: Never used  ?Substance and Sexual Activity  ? Alcohol use: Yes  ?  Alcohol/week: 0.0 standard drinks  ? Drug use: No  ? Sexual activity: Yes  ?  Birth control/protection:  Condom  ?Other Topics Concern  ? Not on file  ?Social History Narrative  ? Not on file  ? ?Social Determinants of Health  ? ?Financial Resource Strain: Not on file  ?Food Insecurity: Not on file  ?Transportation Needs: Not on file  ?Physical Activity: Not on file  ?Stress: Not on file  ?Social Connections: Not on file  ?Intimate Partner Violence: Not on file  ? ? ?Review of Systems: ?See HPI, otherwise negative ROS ? ?Physical Exam: ?BP (!) 152/98   Pulse 74   Temp (!) 97.2 ?F (36.2 ?C) (Temporal)   Resp 18   Ht '5\' 9"'  (1.753 m)   Wt 76.7 kg   SpO2 100%   BMI 24.96 kg/m?  ?General:   Alert,  pleasant and cooperative in NAD ?Head:  Normocephalic and atraumatic. ?Neck:  Supple; no masses or thyromegaly. ?Lungs:  Clear throughout to auscultation, normal respiratory effort.     ?Heart:  +S1, +S2, Regular rate and rhythm, No edema. ?Abdomen:  Soft, nontender and nondistended. Normal bowel sounds, without guarding, and without rebound.   ?Neurologic:  Alert and  oriented x4;  grossly normal neurologically. ? ?Impression/Plan: ?Traci Clements is here for an endoscopy and colonoscopy  to be performed for  evaluation of abdominal pain  ?   ?Risks, benefits, limitations, and alternatives regarding endoscopy have been reviewed with the patient.  Questions have been answered.  All parties agreeable. ? ? ?Jonathon Bellows, MD  09/14/2021, 12:27 PM ?Abdomina  ?

## 2021-09-14 NOTE — Op Note (Signed)
[facility] ?Gastroenterology ?Patient Name: Traci Clements ?Procedure Date: 09/14/2021 11:09 AM ?MRN: [id number] ?Account #: [id number] ?Date of Birth: Sep 03, 1970 ?Admit Type: Outpatient ?Age: 51 ?Room: [facility] ENDO ROOM 3 ?Gender: Female ?Note Status: Finalized ?Instrument Name: Colonoscope [id number] ?Procedure:             Colonoscopy ?Indications:           Abdominal pain ?Providers:             Jonathon Bellows MD, MD ?Referring MD:          Marguerita Merles, MD (Referring MD) ?Medicines:             Monitored Anesthesia Care ?Complications:         No immediate complications. ?Procedure:             Pre-Anesthesia Assessment: ?                       - Prior to the procedure, a History and Physical was  ?                       performed, and patient medications, allergies and  ?                       sensitivities were reviewed. The patient's tolerance  ?                       of previous anesthesia was reviewed. ?                       - The risks and benefits of the procedure and the  ?                       sedation options and risks were discussed with the  ?                       patient. All questions were answered and informed  ?                       consent was obtained. ?                       - ASA Grade Assessment: II - A patient with mild  ?                       systemic disease. ?                       After obtaining informed consent, the colonoscope was  ?                       passed under direct vision. Throughout the procedure,  ?                       the patient's blood pressure, pulse, and oxygen  ?                       saturations were monitored continuously. The  ?                       Colonoscope was introduced through the  anus and  ?                       advanced to the the cecum, identified by the  ?                       appendiceal orifice. The colonoscopy was performed  ?                       with ease. The patient tolerated the procedure well.  ?                        The quality of the bowel preparation was excellent. ?Findings: ?     The perianal and digital rectal examinations were normal. ?     Four sessile polyps were found in the ascending colon. The polyps were 4  ?     to 6 mm in size. These polyps were removed with a cold snare. Resection  ?     and retrieval were complete. ?     A 5 mm polyp was found in the descending colon. The polyp was sessile.  ?     The polyp was removed with a cold snare. Resection and retrieval were  ?     complete. ?     The exam was otherwise without abnormality on direct and retroflexion  ?     views. ?Impression:            - Four 4 to 6 mm polyps in the ascending colon,  ?                       removed with a cold snare. Resected and retrieved. ?                       - One 5 mm polyp in the descending colon, removed with  ?                       a cold snare. Resected and retrieved. ?                       - The examination was otherwise normal on direct and  ?                       retroflexion views. ?Recommendation:        - Discharge patient to home. ?                       - Resume previous diet. ?                       - Continue present medications. ?                       - Await pathology results. ?                       - Repeat colonoscopy for surveillance based on  ?                       pathology results. ?                       -  Return to GI office today. ?Procedure Code(s):     --- Professional --- ?                       [id number], Colonoscopy, flexible; with removal of  ?                       tumor(s), polyp(s), or other lesion(s) by snare  ?                       technique ?Diagnosis Code(s):     --- Professional --- ?                       K63.5, Polyp of colon ?                       R10.9, Unspecified abdominal pain ?CPT copyright 2019 American Medical Association. All rights reserved. ?The codes documented in this report are preliminary and upon coder review may  ?be revised to meet current compliance  requirements. ?Jonathon Bellows, MD ?Jonathon Bellows MD, MD ?09/14/2021 12:55:20 PM ?This report has been signed electronically. ?Number of Addenda: 0 ?Note Initiated On: 09/14/2021 11:09 AM ?Scope Withdrawal Time: 0 hours 10 minutes 26 seconds  ?Total Procedure Duration: 0 hours 12 minutes 20 seconds  ?Estimated Blood Loss:  Estimated blood loss: none. ?     [facility] ?

## 2021-09-14 NOTE — Anesthesia Preprocedure Evaluation (Addendum)
Anesthesia Evaluation  ?Patient identified by MRN, date of birth, ID band ?Patient awake ? ? ? ?Reviewed: ?Allergy & Precautions, H&P , NPO status , Patient's Chart, lab work & pertinent test results, reviewed documented beta blocker date and time  ? ?History of Anesthesia Complications ?Negative for: history of anesthetic complications ? ?Airway ?Mallampati: I ? ?TM Distance: >3 FB ?Neck ROM: full ? ? ? Dental ? ?(+) Dental Advidsory Given, Teeth Intact ?  ?Pulmonary ?neg pulmonary ROS, former smoker,  ?  ?Pulmonary exam normal ?breath sounds clear to auscultation ? ? ? ? ? ? Cardiovascular ?Exercise Tolerance: Good ?negative cardio ROS ?Normal cardiovascular exam ?Rhythm:regular Rate:Normal ? ? ?  ?Neuro/Psych ?negative neurological ROS ? negative psych ROS  ? GI/Hepatic ?Neg liver ROS, GERD  ,  ?Endo/Other  ?negative endocrine ROS ? Renal/GU ?negative Renal ROS  ?negative genitourinary ?  ?Musculoskeletal ? ? Abdominal ?  ?Peds ? Hematology ?negative hematology ROS ?(+)   ?Anesthesia Other Findings ?Past Medical History: ?No date: Anemia ?07/15/2013: BRCA negative ?    Comment:  MyRisk neg ?06/2013: Breast cancer ([place]) ?    Comment:  with rad tx, right breast ?06/2013: Cancer ([place]) ?    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             ER 90%, PR 90%; wide excision, whole breast radiation ?No date: Esophageal reflux ?2015: Personal history of radiation therapy ?    Comment:  RIGHT lumpectomy ?03/2016: Vitamin D deficiency ? ? Reproductive/Obstetrics ?negative OB ROS ? ?  ? ? ? ? ? ? ? ? ? ? ? ? ? ?  ?  ? ? ? ? ? ? ? ?Anesthesia Physical ?Anesthesia Plan ? ?ASA: 2 ? ?Anesthesia Plan: General  ? ?Post-op Pain Management:   ? ?Induction: Intravenous ? ?PONV Risk Score and Plan: 3 and Propofol infusion and TIVA ? ?Airway Management Planned: Natural Airway and Nasal Cannula ? ?Additional Equipment:  ? ?Intra-op Plan:  ? ?Post-operative Plan:  ? ?Informed  Consent: I have reviewed the patients History and Physical, chart, labs and discussed the procedure including the risks, benefits and alternatives for the proposed anesthesia with the patient or authorized representative who has indicated his/her understanding and acceptance.  ? ? ? ?Dental Advisory Given ? ?Plan Discussed with: Anesthesiologist, CRNA and Surgeon ? ?Anesthesia Plan Comments:   ? ? ? ? ? ? ?Anesthesia Quick Evaluation ? ?

## 2021-09-15 ENCOUNTER — Encounter: Payer: Self-pay | Admitting: Gastroenterology

## 2021-09-15 LAB — SURGICAL PATHOLOGY

## 2021-09-19 NOTE — Anesthesia Postprocedure Evaluation (Signed)
Anesthesia Post Note ? ?Patient: Traci Clements ? ?Procedure(s) Performed: ESOPHAGOGASTRODUODENOSCOPY (EGD) WITH PROPOFOL ?COLONOSCOPY WITH PROPOFOL ? ?Patient location during evaluation: Endoscopy ?Anesthesia Type: General ?Level of consciousness: awake and alert ?Pain management: pain level controlled ?Vital Signs Assessment: post-procedure vital signs reviewed and stable ?Respiratory status: spontaneous breathing, nonlabored ventilation, respiratory function stable and patient connected to nasal cannula oxygen ?Cardiovascular status: blood pressure returned to baseline and stable ?Postop Assessment: no apparent nausea or vomiting ?Anesthetic complications: no ? ? ?No notable events documented. ? ? ?Last Vitals:  ?Vitals:  ? 09/14/21 1306 09/14/21 1316  ?BP: 140/79 (!) 154/101  ?Pulse: 71   ?Resp: 18 19  ?Temp:    ?SpO2: 100%   ?  ?Last Pain:  ?Vitals:  ? 09/14/21 1306  ?TempSrc:   ?PainSc: 0-No pain  ? ? ?  ?  ?  ?  ?  ?  ? ?Martha Clan ? ? ? ? ?

## 2021-09-26 ENCOUNTER — Encounter: Payer: Self-pay | Admitting: Gastroenterology

## 2021-10-11 ENCOUNTER — Other Ambulatory Visit: Payer: Medicaid Other

## 2021-10-11 ENCOUNTER — Ambulatory Visit (LOCAL_COMMUNITY_HEALTH_CENTER): Payer: Self-pay

## 2021-10-11 DIAGNOSIS — Z111 Encounter for screening for respiratory tuberculosis: Secondary | ICD-10-CM

## 2021-10-14 ENCOUNTER — Ambulatory Visit (LOCAL_COMMUNITY_HEALTH_CENTER): Payer: Self-pay

## 2021-10-14 DIAGNOSIS — Z111 Encounter for screening for respiratory tuberculosis: Secondary | ICD-10-CM

## 2021-10-14 LAB — TB SKIN TEST
Induration: 0 mm
TB Skin Test: NEGATIVE

## 2021-11-28 ENCOUNTER — Other Ambulatory Visit: Payer: Self-pay | Admitting: Family Medicine

## 2021-11-28 ENCOUNTER — Ambulatory Visit
Admission: RE | Admit: 2021-11-28 | Discharge: 2021-11-28 | Disposition: A | Discharge status: Home / Self Care | Payer: Medicaid Other | Source: Ambulatory Visit | Attending: Family Medicine | Admitting: Family Medicine

## 2021-11-28 DIAGNOSIS — R0789 Other chest pain: Secondary | ICD-10-CM | POA: Insufficient documentation

## 2021-12-23 ENCOUNTER — Ambulatory Visit: Admit: 2021-12-23 | Discharge: 2021-12-24 | Payer: PRIVATE HEALTH INSURANCE

## 2021-12-29 ENCOUNTER — Ambulatory Visit: Payer: Medicaid Other | Admitting: Gastroenterology

## 2022-01-10 ENCOUNTER — Ambulatory Visit
Admit: 2022-01-10 | Discharge: 2022-01-11 | Payer: PRIVATE HEALTH INSURANCE | Attending: Pulmonary Disease | Primary: Pulmonary Disease

## 2022-01-10 DIAGNOSIS — R918 Other nonspecific abnormal finding of lung field: Principal | ICD-10-CM

## 2022-01-24 ENCOUNTER — Other Ambulatory Visit: Payer: Self-pay | Admitting: Family Medicine

## 2022-01-24 DIAGNOSIS — Z1231 Encounter for screening mammogram for malignant neoplasm of breast: Secondary | ICD-10-CM

## 2022-01-27 ENCOUNTER — Ambulatory Visit: Admit: 2022-01-27 | Discharge: 2022-01-28 | Payer: PRIVATE HEALTH INSURANCE

## 2022-01-31 ENCOUNTER — Ambulatory Visit: Admit: 2022-01-31 | Discharge: 2022-02-01 | Payer: PRIVATE HEALTH INSURANCE

## 2022-01-31 DIAGNOSIS — R911 Solitary pulmonary nodule: Principal | ICD-10-CM

## 2022-02-07 DIAGNOSIS — R0602 Shortness of breath: Principal | ICD-10-CM

## 2022-02-07 DIAGNOSIS — R911 Solitary pulmonary nodule: Principal | ICD-10-CM

## 2022-02-10 ENCOUNTER — Ambulatory Visit: Admit: 2022-02-10 | Discharge: 2022-02-11 | Payer: PRIVATE HEALTH INSURANCE

## 2022-02-10 ENCOUNTER — Encounter: Admit: 2022-02-10 | Discharge: 2022-02-11 | Payer: PRIVATE HEALTH INSURANCE | Attending: Surgery | Primary: Surgery

## 2022-02-10 ENCOUNTER — Ambulatory Visit: Admit: 2022-02-10 | Discharge: 2022-02-11 | Payer: PRIVATE HEALTH INSURANCE | Attending: Surgery | Primary: Surgery

## 2022-02-10 DIAGNOSIS — R911 Solitary pulmonary nodule: Principal | ICD-10-CM

## 2022-02-16 ENCOUNTER — Encounter
Admit: 2022-02-16 | Discharge: 2022-02-17 | Disposition: A | Discharge status: Home / Self Care | Payer: PRIVATE HEALTH INSURANCE | Attending: Surgery | Admitting: Surgery

## 2022-02-16 ENCOUNTER — Ambulatory Visit
Admit: 2022-02-16 | Discharge: 2022-02-17 | Disposition: A | Discharge status: Home / Self Care | Payer: PRIVATE HEALTH INSURANCE | Admitting: Surgery

## 2022-02-16 ENCOUNTER — Encounter
Admit: 2022-02-16 | Discharge: 2022-02-17 | Disposition: A | Discharge status: Home / Self Care | Payer: PRIVATE HEALTH INSURANCE | Admitting: Surgery

## 2022-02-17 MED ORDER — GABAPENTIN 300 MG CAPSULE
ORAL_CAPSULE | Freq: Three times a day (TID) | ORAL | 0 refills | 30.00000 days | Status: CP
Start: 2022-02-17 — End: 2022-03-19
  Filled 2022-02-17: qty 90, 30d supply, fill #0

## 2022-02-17 MED ORDER — ACETAMINOPHEN 500 MG TABLET
ORAL_TABLET | Freq: Three times a day (TID) | ORAL | 0 refills | 14.00000 days | Status: CP
Start: 2022-02-17 — End: 2022-03-03
  Filled 2022-02-17: qty 84, 14d supply, fill #0
  Filled 2022-02-17: qty 20, 3d supply, fill #0

## 2022-02-17 MED ORDER — OXYCODONE 5 MG TABLET
ORAL_TABLET | ORAL | 0 refills | 4.00000 days | Status: CP | PRN
Start: 2022-02-17 — End: 2022-02-22

## 2022-02-17 MED ORDER — POLYETHYLENE GLYCOL 3350 17 GRAM ORAL POWDER PACKET
PACK | Freq: Two times a day (BID) | ORAL | 0 refills | 7.00000 days | Status: CP
Start: 2022-02-17 — End: 2022-02-24
  Filled 2022-02-17: qty 14, 7d supply, fill #0

## 2022-02-22 ENCOUNTER — Encounter: Admit: 2022-02-22 | Discharge: 2022-02-22 | Payer: PRIVATE HEALTH INSURANCE | Attending: Surgery | Primary: Surgery

## 2022-02-22 DIAGNOSIS — C3490 Malignant neoplasm of unspecified part of unspecified bronchus or lung: Principal | ICD-10-CM

## 2022-02-22 DIAGNOSIS — C7800 Secondary malignant neoplasm of unspecified lung: Principal | ICD-10-CM

## 2022-03-02 ENCOUNTER — Ambulatory Visit
Admit: 2022-03-02 | Discharge: 2022-03-03 | Payer: PRIVATE HEALTH INSURANCE | Attending: Adult Health | Primary: Adult Health

## 2022-03-02 ENCOUNTER — Ambulatory Visit
Admit: 2022-03-02 | Discharge: 2022-03-03 | Payer: PRIVATE HEALTH INSURANCE | Attending: Internal Medicine | Primary: Internal Medicine

## 2022-03-02 ENCOUNTER — Ambulatory Visit: Admit: 2022-03-02 | Discharge: 2022-03-03 | Payer: PRIVATE HEALTH INSURANCE

## 2022-03-02 DIAGNOSIS — C3490 Malignant neoplasm of unspecified part of unspecified bronchus or lung: Principal | ICD-10-CM

## 2022-03-10 ENCOUNTER — Ambulatory Visit
Admission: RE | Admit: 2022-03-10 | Discharge: 2022-03-10 | Disposition: A | Discharge status: Home / Self Care | Payer: Medicaid Other | Source: Ambulatory Visit | Attending: Family Medicine | Admitting: Family Medicine

## 2022-03-10 DIAGNOSIS — Z1231 Encounter for screening mammogram for malignant neoplasm of breast: Secondary | ICD-10-CM | POA: Diagnosis not present

## 2022-03-10 DIAGNOSIS — C7800 Secondary malignant neoplasm of unspecified lung: Principal | ICD-10-CM

## 2022-03-10 MED ORDER — OSIMERTINIB 80 MG TABLET
ORAL_TABLET | Freq: Every day | ORAL | 5 refills | 30 days | Status: CP
Start: 2022-03-10 — End: 2022-09-06
  Filled 2022-03-15: qty 30, 30d supply, fill #0

## 2022-03-13 NOTE — Unmapped (Signed)
[facility] Specialty Medication Onboarding    Specialty Medication: Tagrisso  Prior Authorization: Not Required   Financial Assistance: No - copay  <$25  Final Copay/Day Supply: $0 / 30    Insurance Restrictions: None     Notes to Pharmacist:     The triage team has completed the benefits investigation and has determined that the patient is able to fill this medication at [facility]. Please contact the patient to complete the onboarding or follow up with the prescribing physician as needed.

## 2022-03-14 DIAGNOSIS — C7800 Secondary malignant neoplasm of unspecified lung: Principal | ICD-10-CM

## 2022-03-14 NOTE — Unmapped (Signed)
[facility]   Patient Onboarding/Medication Counseling    Kathleen Galvan is a 51 y.o. female with adenocarcinoma of lung who I am counseling today on initiation of therapy.  I am speaking to the patient.    Was a Nurse, learning disability used for this call? No    Verified patient's date of birth / HIPAA.    Specialty medication(s) to be sent: Hematology/Oncology: Tagrisso    Non-specialty medications/supplies to be sent: none    Medications not needed at this time: none     Tagrisso (Osimertinib)    Medication & Administration     Dosage:   Take 1 tablet (80mg ) by mouth once daily    Administration:   Take with or without food  Take at the same time each day    Adherence/Missed dose instructions:  Skip the missed dose and go back to normal schedule  Do not take 2 doses at the same time or extra doses    Goals of Therapy     To prevent disease progression    Side Effects & Monitoring Parameters     Common side effects  Dry skin or skin rash  Change in nails  Diarrhea or constipation, Upset stomach, nausea or throwing up.  Not hungry.  Mouth irritation or mouth sores.  Headache.  Feeling tired or weak.  Signs of a common cold.    The following side effects should be reported to the provider:  Signs of an infection  Allergic reaction (rash; hives; itching; tightness in the chest or throat; trouble breathing; or swelling of the mouth, face, lips, tongue, or throat)  Electrolyte problems (mood changes, confusion, muscle pain or weakness, an abnormal heartbeat, seizures, not hungry, or very bad upset stomach)  Severe skin reaction (like red, swollen, blistered, or peeling skin; skin reaction that looks like rings; red or irritated eyes; or sores in the mouth, throat, nose, or eyes.  Dizziness  Change in eyesight, eye pain, or very bad eye irritation.  If bright lights bother your eyes.  Watery eyes.  Lung or breathing problems (trouble breathing, shortness of breath, or a cough that is new or worse)  Shortness of breath, a big weight gain, or swelling in the arms or legs.      Contraindications, Warnings, & Precautions     Bone marrow suppression  Cardiovascular toxicity, specifically cardiomyopathy  Cutaneous toxicity: Stevens-Johnson syndrome and erythema multiforme major  Fertility effects: may impair fertility. Effects may be reversible in females  GI toxicity: diarrhea  Ocular toxicity: keratitis  Pulmonary toxicity: ILD and pneumonitis    Pregnancy & Lactation Considerations  Females of reproductive potential should use effective contraception during therapy and for 6 weeks after the last dose.   Males with female partners of reproductive potential should also use effective contraception during therapy and for 4 months after the last dose.  Breastfeeding is not recommended by the manufacturer during therapy and for 2 weeks after the last dose.    Drug/Food Interactions     Medication list reviewed in Epic. The patient was instructed to inform the care team before taking any new medications or supplements. No drug interactions identified.   Ask your doctor before getting any live or inactivated vaccines while on therapy    Storage, Handling Precautions, & Disposal     Store at room temperature  Keep away from children and pets  This drug is considered hazardous and should be handled as little as possible.  If someone else helps  with medication administration, they should wear gloves.    Current Medications (including OTC/herbals), Comorbidities and Allergies     Current Outpatient Medications   Medication Sig Dispense Refill    gabapentin (NEURONTIN) 300 MG capsule Take 1 capsule (300 mg total) by mouth Three (3) times a day. 90 capsule 0    ibuprofen (MOTRIN) 400 MG tablet Take 1 tablet (400 mg total) by mouth every six (6) hours as needed for pain.      osimertinib (TAGRISSO) 80 mg tablet Take 1 tablet (80 mg total) by mouth daily. 30 tablet 5     No current facility-administered medications for this visit. Allergies   Allergen Reactions    Codeine Hcl Nausea Only    Latex, Natural Rubber Itching and Rash       Patient Active Problem List   Diagnosis    Pleural mass    Breast cancer (CMS-HCC)    Cervical radiculopathy    DCIS (ductal carcinoma in situ)    Unspecified benign mammary dysplasia of unspecified breast    Metastatic adenocarcinoma to lung (CMS-HCC)     Reviewed and up to date in Epic.    Appropriateness of Therapy     Acute infections noted within Epic:  No active infections  Patient reported infection: None    Is medication and dose appropriate based on diagnosis and infection status? Yes    Prescription has been clinically reviewed: Yes    Baseline Quality of Life Assessment      How many days over the past month did your adenocarcinoma of lung  keep you from your normal activities? For example, brushing your teeth or getting up in the morning. 0    Financial Information     Medication Assistance provided: None Required    Anticipated copay of $0 / 30 days reviewed with patient. Verified delivery address.    Delivery Information     Scheduled delivery date: 03/16/22    Expected start date: ASAP    Medication will be delivered via Next Day Courier to the prescription address in [facility].  This shipment will not require a signature.      Explained the services we provide at [facility] and that each month we would call to set up refills.  Stressed importance of returning phone calls so that we could ensure they receive their medications in time each month.  Informed patient that we should be setting up refills 7-10 days prior to when they will run out of medication.  A pharmacist will reach out to perform a clinical assessment periodically.  Informed patient that a welcome packet, containing information about our pharmacy and other support services, a Notice of Privacy Practices, and a drug information handout will be sent.      The patient or caregiver noted above participated in the development of this care plan and knows that they can request review of or adjustments to the care plan at any time.      Patient or caregiver verbalized understanding of the above information as well as how to contact the pharmacy at [phone] option 4 with any questions/concerns.  The pharmacy is open Monday through Friday 8:30am-4:30pm.  A pharmacist is available 24/7 via pager to answer any clinical questions they may have.    Patient Specific Needs     Does the patient have any physical, cognitive, or cultural barriers? No    Does the patient have adequate living arrangements? (i.e. the ability to  store and take their medication appropriately) Yes    Did you identify any home environmental safety or security hazards? No    Patient prefers to have medications discussed with  Patient     Is the patient or caregiver able to read and understand education materials at a high school level or above? Yes    Patient's primary language is  English     Is the patient high risk? No    SOCIAL DETERMINANTS OF HEALTH     At the [facility], we have learned that life circumstances - like trouble affording food, housing, utilities, or transportation can affect the health of many of our patients.   That is why we wanted to ask: are you currently experiencing any life circumstances that are negatively impacting your health and/or quality of life? No    Social Determinants of Psychologist, prison and probation services Strain: Not on file   Internet Connectivity: Not on file   Food Insecurity: Not on file   Tobacco Use: Medium Risk (02/17/2022)    Patient History     Smoking Tobacco Use: Former     Smokeless Tobacco Use: Unknown     Passive Exposure: Not on file   Housing/Utilities: Not on file   Alcohol Use: Not on file   Transportation Needs: Not on file   Substance Use: Not on file   Health Literacy: Not on file   Physical Activity: Not on file   Interpersonal Safety: Not on file   Stress: Not on file   Intimate Partner Violence: Not on file   Depression: Not on file   Social Connections: Not on file     Would you be willing to receive help with any of the needs that you have identified today? Not applicable     Kermit Balo, [facility]  [facility] Specialty Pharmacist

## 2022-04-06 ENCOUNTER — Ambulatory Visit
Admit: 2022-04-06 | Discharge: 2022-04-07 | Payer: PRIVATE HEALTH INSURANCE | Attending: Internal Medicine | Primary: Internal Medicine

## 2022-04-06 ENCOUNTER — Other Ambulatory Visit: Admit: 2022-04-06 | Discharge: 2022-04-07 | Payer: PRIVATE HEALTH INSURANCE

## 2022-04-06 DIAGNOSIS — C7800 Secondary malignant neoplasm of unspecified lung: Principal | ICD-10-CM

## 2022-04-06 LAB — CBC W/ AUTO DIFF
BASOPHILS ABSOLUTE COUNT: 0 10*9/L (ref 0.0–0.1)
BASOPHILS RELATIVE PERCENT: 0.7 %
EOSINOPHILS ABSOLUTE COUNT: 0.6 10*9/L — ABNORMAL HIGH (ref 0.0–0.5)
EOSINOPHILS RELATIVE PERCENT: 13.1 %
HEMATOCRIT: 34.3 % (ref 34.0–44.0)
HEMOGLOBIN: 11.7 g/dL (ref 11.3–14.9)
LYMPHOCYTES ABSOLUTE COUNT: 1.1 10*9/L (ref 1.1–3.6)
LYMPHOCYTES RELATIVE PERCENT: 25.6 %
MEAN CORPUSCULAR HEMOGLOBIN CONC: 34.3 g/dL (ref 32.0–36.0)
MEAN CORPUSCULAR HEMOGLOBIN: 31 pg (ref 25.9–32.4)
MEAN CORPUSCULAR VOLUME: 90.3 fL (ref 77.6–95.7)
MEAN PLATELET VOLUME: 6.9 fL (ref 6.8–10.7)
MONOCYTES ABSOLUTE COUNT: 0.5 10*9/L (ref 0.3–0.8)
MONOCYTES RELATIVE PERCENT: 11.6 %
NEUTROPHILS ABSOLUTE COUNT: 2.1 10*9/L (ref 1.8–7.8)
NEUTROPHILS RELATIVE PERCENT: 49 %
PLATELET COUNT: 301 10*9/L (ref 150–450)
RED BLOOD CELL COUNT: 3.8 10*12/L — ABNORMAL LOW (ref 3.95–5.13)
RED CELL DISTRIBUTION WIDTH: 14.8 % (ref 12.2–15.2)
WBC ADJUSTED: 4.3 10*9/L (ref 3.6–11.2)

## 2022-04-06 LAB — COMPREHENSIVE METABOLIC PANEL
ALBUMIN: 3.7 g/dL (ref 3.4–5.0)
ALKALINE PHOSPHATASE: 89 U/L (ref 46–116)
ALT (SGPT): 17 U/L (ref 10–49)
ANION GAP: 6 mmol/L (ref 5–14)
AST (SGOT): 35 U/L — ABNORMAL HIGH (ref <=34)
BILIRUBIN TOTAL: 0.3 mg/dL (ref 0.3–1.2)
BLOOD UREA NITROGEN: 14 mg/dL (ref 9–23)
BUN / CREAT RATIO: 18
CALCIUM: 9.5 mg/dL (ref 8.7–10.4)
CHLORIDE: 107 mmol/L (ref 98–107)
CO2: 27 mmol/L (ref 20.0–31.0)
CREATININE: 0.78 mg/dL
EGFR CKD-EPI (2021) FEMALE: >90 mL/min/{1.73_m2} (ref >=60)
GLUCOSE RANDOM: 76 mg/dL (ref 70–179)
POTASSIUM: 3.9 mmol/L (ref 3.4–4.8)
PROTEIN TOTAL: 7.3 g/dL (ref 5.7–8.2)
SODIUM: 140 mmol/L (ref 135–145)

## 2022-04-06 NOTE — Unmapped (Signed)
[facility] Clinical Assessment & Refill Coordination Note    Kathleen Galvan, DOB: 04/02/1971  Phone: [phone] (home) [phone] (work)    All above HIPAA information was verified with patient.     Was a Nurse, learning disability used for this call? No    Specialty Medication(s):   Hematology/Oncology: Tagrisso     Current Outpatient Medications   Medication Sig Dispense Refill    gabapentin (NEURONTIN) 300 MG capsule Take 1 capsule (300 mg total) by mouth Three (3) times a day. 90 capsule 0    ibuprofen (MOTRIN) 400 MG tablet Take 1 tablet (400 mg total) by mouth every six (6) hours as needed for pain.      osimertinib (TAGRISSO) 80 mg tablet Take 1 tablet (80 mg total) by mouth daily. 30 tablet 5     No current facility-administered medications for this visit.        Changes to medications: Kathleen Galvan reports no changes at this time.    Allergies   Allergen Reactions    Codeine Hcl Nausea Only    Latex, Natural Rubber Itching and Rash       Changes to allergies: No    SPECIALTY MEDICATION ADHERENCE     Tagrisso 80 mg: 10 days of medicine on hand     Medication Adherence    Patient reported X missed doses in the last month: 0  Specialty Medication: Tagrisso 80 mg - 1 tab once daily  Patient is on additional specialty medications: No  Informant: patient                  Confirmed plan for next specialty medication refill: delivery by pharmacy  Refills needed for supportive medications: not needed          Specialty medication(s) dose(s) confirmed: Regimen is correct and unchanged.     Are there any concerns with adherence? No    Adherence counseling provided? Not needed    CLINICAL MANAGEMENT AND INTERVENTION      Clinical Benefit Assessment:    Do you feel the medicine is effective or helping your condition? Yes    Clinical Benefit counseling provided? Not needed    Adverse Effects Assessment:    Are you experiencing any side effects? No    Are you experiencing difficulty administering your medicine? No    Quality of Life Assessment:    Quality of Life    Rheumatology  Oncology  Dermatology  Cystic Fibrosis          How many days over the past month did your Secondary adenocarcinoma of lung  keep you from your normal activities? For example, brushing your teeth or getting up in the morning. 0    Have you discussed this with your provider? Not needed    Acute Infection Status:    Acute infections noted within Epic:  No active infections  Patient reported infection: None    Therapy Appropriateness:    Is therapy appropriate and patient progressing towards therapeutic goals? Yes, therapy is appropriate and should be continued    DISEASE/MEDICATION-SPECIFIC INFORMATION      N/A    Oncology: Is the patient receiving adequate infection prevention treatment? Not applicable  Does the patient have adequate nutritional support? Not applicable    PATIENT SPECIFIC NEEDS     Does the patient have any physical, cognitive, or cultural barriers? No    Is the patient high risk? No    Did the patient require a clinical intervention?  No    Does the patient require physician intervention or other additional services (i.e., nutrition, smoking cessation, social work)? No    SOCIAL DETERMINANTS OF HEALTH     At the [facility], we have learned that life circumstances - like trouble affording food, housing, utilities, or transportation can affect the health of many of our patients.   That is why we wanted to ask: are you currently experiencing any life circumstances that are negatively impacting your health and/or quality of life? Patient declined to answer    Social Determinants of Health     Financial Resource Strain: Not on file   Internet Connectivity: Not on file   Food Insecurity: Not on file   Tobacco Use: Medium Risk (02/17/2022)    Patient History     Smoking Tobacco Use: Former     Smokeless Tobacco Use: Unknown     Passive Exposure: Not on file   Housing/Utilities: Not on file   Alcohol Use: Not on file Transportation Needs: Not on file   Substance Use: Not on file   Health Literacy: Not on file   Physical Activity: Not on file   Interpersonal Safety: Not on file   Stress: Not on file   Intimate Partner Violence: Not on file   Depression: Not on file   Social Connections: Not on file       Would you be willing to receive help with any of the needs that you have identified today? Not applicable       SHIPPING     Specialty Medication(s) to be Shipped:   Hematology/Oncology: Tagrisso    Other medication(s) to be shipped: No additional medications requested for fill at this time     Changes to insurance: No    Delivery Scheduled: Yes, Expected medication delivery date: 04/12/22.     Medication will be delivered via Next Day Courier to the confirmed prescription address in [facility].    The patient will receive a drug information handout for each medication shipped and additional FDA Medication Guides as required.  Verified that patient has previously received a Conservation officer, historic buildings and a Surveyor, mining.    The patient or caregiver noted above participated in the development of this care plan and knows that they can request review of or adjustments to the care plan at any time.      All of the patient's questions and concerns have been addressed.    Kathleen Galvan, [facility]   [facility] Specialty Pharmacist

## 2022-04-06 NOTE — Unmapped (Signed)
I spoke with [name], in-person ahead of her clinic visit with Dr. Ronnette Juniper to generally discuss clinical trial participation, and the [employer]'s clinical trial patient navigator program.     We spoke broadly about clinical trials, [place]'s academic research and study opportunities, and the ways someone could take part in a trial should they want to (including observational studies, donating tissue from a biopsy to biospecimen research, or taking part in a treatment trial).     Ms.Brager expressed interest in participating in a trial should one become an option for her, and we discussed the importance of speaking to her care team early and often about this interest, should a new trial become available. We also discussed resources for clinical trial participation to ease the burden of taking part in one.    Ms Grimsrud lives in [place] and is supported by her mother, Thurston Hole, today. We discussed the CC[employer] Support Groups for both patients and caregivers, as well as the [facility] space as a decompression area during treatment or long appointment days, and their massage therapy services for patients. We briefly discussed the financial assistance, advance directive planning, and other CCSP programs available, and Ms. Barreras will reach out if those needs arise.    I provided my contact information and remain available to help answer future questions or concerns about taking part in cancer research.    Resources provided include: Secondary school teacher (bookmark with questions for their provider, ways to take part in research, and a Clinical Trial Glossary sheet), Clinical Trial Patient Navigator contact information, Research4Me and https://www.mills.com/ information, [facility] Support Programs and information.    --  Donella Stade  Clinical Trial Patient Navigator  [employer]   Pronouns: she/her  Email: [email or web address]  Voicemail: [phone]

## 2022-04-07 NOTE — Unmapped (Signed)
Thoracic Medical Oncology Clinic    Reason for visit: Evaluation and treatment recommendations for non-small-cell lung cancer    History of Present Illness: Ms. Kathleen Galvan is a 51 y.o. year old female with a past medical and oncologic history per below who presents to clinic for evaluation and treatment recommendations for lung adenocarcinoma. Tolerated 2 weeks of Osimertinib well, no complaints, no diarrhea, no rash, good energy, no limitations.    Review of Systems: All systems have been reviewed, pertinent positives and negative per history of present illness, otherwise deemed unremarkable.    Oncologic History: (Stage IVa: [id number], lung adenocarcinoma, PD-L1 0%, EGFR del19 mutation subtype)  - Experiencing progressive left-sided pain for the past year, as well as productive cough  - 12/23/2021 CT chest: 2.4 cm solid spiculated nodule in the apical LUL, left pleural nodules, 1.7 cm left fissural nodule, multiple additional subcentimeter fissural nodules involving the left major fissure, 0.9 cm right fissural nodule, prevascular lymphadenopathy 3.4 cm, bulkly level 5/6 adenopathy  - PET/CT 01/27/2022: FDG avid spiculated left apical nodule 2.4 cm, multiple additional minimally FDG avid solid perifissural pulmonary nodules in the left lung, subpleural nodularity avid.??Intensely FDG avid anterior mediastinal lymph node measuring up to 3.1 cm  - Brain MRI 01/27/2022: no significant abnormalities  - MTOP discussion, pleural and mediastinal sampling was recommended  - 02/16/22 Left VATS: left pleura and station 5, lung adenocarcinoma  - 03/14/22: Started Osimertinib 80mg  qD    Allergies: Reviewed    Medications: Reviewed    Past Medical History:  Lung Cancer  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 Also has second hand smoke exposure with her husband.    Family History: Reviewed, non-contributory    Physical Examination:  Vitals: BP 127/70  - Pulse 89  - Temp 36.7 ??C (98 ??F) (Oral)  - Resp 16  - Ht 175.3 cm (5' 9)  - Wt 79.6 kg (175 lb 8 oz)  - SpO2 100%  - BMI 25.92 kg/m??   Performance Status: 1  General: alert and oriented, lucid, no distress and interactive  HEENT: EOMI, no scleral icterus, no oral lesions  Neck: no cervical or supraclavicular lymphadenopathy appreciated, supple  Cardiac: regular rate and rhythm, S1 S2 appreciated, no murmurs or gallops  Pulmonary: no dullness to percussion, good air movement, no crackles or wheezes  Abdomen: soft, non-tender, non-distended, normal bowel sounds, no organomegally  Extremities: no pedal edema, 2+ pulses throughout  Skin: clean, dry and intact, no rash    Imaging Studies: Reviewed per the Radiology interpretation, as well as my own personal assessment.    Pertinent Laboratory: Reviewed    Assessment: Kathleen Galvan is a pleasant 51 y.o. year old female with lung adenocarcinoma who presents to clinic for evaluation and treatment recommendations. She has an excellent PS and no concerning co-morbidities. Technically stage IVA disease based on the pleural disease, however the right fissural lesion is concerning. She has a relatively low smoking history, so may have a chance for an actionable lesion on NGS.     Plan:  1. 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, PD-L1 0%):  - Continue Osimertinib 80mg  PO qDaily  - RTC in 6 weeks with labs and for CT Chest to evaluation response to therapy

## 2022-04-11 DIAGNOSIS — C7800 Secondary malignant neoplasm of unspecified lung: Principal | ICD-10-CM

## 2022-04-11 MED FILL — TAGRISSO 80 MG TABLET: ORAL | 30 days supply | Qty: 30 | Fill #1

## 2022-04-20 DIAGNOSIS — C7800 Secondary malignant neoplasm of unspecified lung: Principal | ICD-10-CM

## 2022-04-26 ENCOUNTER — Ambulatory Visit: Admit: 2022-04-26 | Discharge: 2022-04-27 | Payer: PRIVATE HEALTH INSURANCE

## 2022-04-26 MED ORDER — ONDANSETRON HCL 4 MG TABLET
ORAL_TABLET | Freq: Three times a day (TID) | ORAL | 1 refills | 10 days | Status: CP | PRN
Start: 2022-04-26 — End: ?

## 2022-04-26 NOTE — Unmapped (Signed)
Clinical Pharmacist Practitioner: Lung Clinic    Patient Name: Kathleen Galvan  Patient Age: 51 y.o.    Assessment and recommendations:    1.NSCLC - Stage IV lung adenocarcinoma with exon19del, PD-L1 0%. Began first-line osimertinib 03/16/22 without complication.   - Continue osimertinib 80 mg daily   - RTC restaging scans in 2 weeks     2. Nausea - due to osimertinib. Describes as intermittent and overall tolerable.   - START ondansetron 4 mg q8h PRN nausea    3. Skin changes - due to osimertinib. Reports dry/itchy skin relieved by moisturizer and tenderness at nail beds.  - Continue to monitor     4. Diarrhea (resolved) - due to osimertinib. Required no intervention and has not experienced in the last 2 weeks.   - Continue to monitor     Follow- up: RTC Dr. Ronnette Juniper 05/11/22  ______________________________________________________________________    Reason for visit:  Osimertinib monitoring and side effect management    HPI:  Ms. Kathleen Galvan is a 51yo F with Stage IV NSCLC (adenocarcinoma) with exon19del who began first-line treatment with osimertinib 03/16/22. She initially presented with progressive left side pain and productive cough. CT chest 12/23/21 demonstrated 2.4 cm nodule in apical LUL, and multiple nodules. Brain MRI negative. Left VATS with adenocarcinoma in left pleura. She was started on first-line osimertinib on 03/16/22.     Interval History:  I spoke with Ms. Kathleen Galvan today for osimertinib management. She reports she is overall doing well and has not missed any doses. She has noticed slight fatigue since starting osimertinib in October but still manages to walk on her treadmill and exercise 5-6 days/week. She initially had diarrhea but it has resolved as of the last 2 weeks. She also had a rash on her foot that has since resolved. She does endorse dry/itchy skin she describes as crawling that is relieved by use of her moisturizer. Also endorses some nail bed tenderness that does not interfere with her ADLs. We discussed letting the team know if these worsen. She has experienced some intermittent nausea but does not have an PRNs available; a script for zofran was sent to her local pharmacy.     Oncology History    No history exists.       Vital Signs for this encounter:  There were no vitals taken for this visit.  Wt Readings from Last 3 Encounters:   04/06/22 79.6 kg (175 lb 8 oz)   03/02/22 76.7 kg (169 lb 1.6 oz)   02/10/22 78.5 kg (173 lb 1.6 oz)       Medications:  Current Outpatient Medications   Medication Sig Dispense Refill    gabapentin (NEURONTIN) 300 MG capsule Take 1 capsule (300 mg total) by mouth Three (3) times a day. 90 capsule 0    ibuprofen (MOTRIN) 400 MG tablet Take 1 tablet (400 mg total) by mouth every six (6) hours as needed for pain.      osimertinib (TAGRISSO) 80 mg tablet Take 1 tablet (80 mg total) by mouth daily. 30 tablet 5     No current facility-administered medications for this visit.       LABS:  Lab Results   Component Value Date    WBC 4.3 04/06/2022    HGB 11.7 04/06/2022    HCT 34.3 04/06/2022    PLT 301 04/06/2022       Lab Results   Component Value Date    NA 140 04/06/2022    K 3.9 04/06/2022  CL 107 04/06/2022    CO2 27.0 04/06/2022    BUN 14 04/06/2022    CREATININE 0.78 04/06/2022    GLU 76 04/06/2022    CALCIUM 9.5 04/06/2022       Lab Results   Component Value Date    BILITOT 0.3 04/06/2022    PROT 7.3 04/06/2022    ALBUMIN 3.7 04/06/2022    ALT 17 04/06/2022    AST 35 (H) 04/06/2022    ALKPHOS 89 04/06/2022       Lab Results   Component Value Date    INR 0.90 02/10/2022       I spent 15 minutes in direct patient care.    Arville Lime, PharmD  PGY2 Oncology Pharmacy Resident

## 2022-05-03 NOTE — Unmapped (Signed)
[facility] Refill Coordination Note    Specialty Medication(s) to be Shipped:   Hematology/Oncology: Tagrisso    Other medication(s) to be shipped: No additional medications requested for fill at this time     Claudelle Scholle, DOB: [birth year]  Phone: [phone] (home) [phone] (work)      All above HIPAA information was verified with patient.     Was a Nurse, learning disability used for this call? No    Completed refill call assessment today to schedule patient's medication shipment from the [facility] [phone]).  All relevant notes have been reviewed.     Specialty medication(s) and dose(s) confirmed: Regimen is correct and unchanged.   Changes to medications: Jakari reports no changes at this time.  Changes to insurance: No  New side effects reported not previously addressed with a pharmacist or physician: None reported  Questions for the pharmacist: No    Confirmed patient received a Conservation officer, historic buildings and a Surveyor, mining with first shipment. The patient will receive a drug information handout for each medication shipped and additional FDA Medication Guides as required.       DISEASE/MEDICATION-SPECIFIC INFORMATION        N/A    SPECIALTY MEDICATION ADHERENCE     Medication Adherence    Patient reported X missed doses in the last month: 0  Specialty Medication: Tagrisso 80 mg  Patient is on additional specialty medications: No  Patient is on more than two specialty medications: No  Any gaps in refill history greater than 2 weeks in the last 3 months: no  Demonstrates understanding of importance of adherence: yes  Informant: patient  Reliability of informant: reliable  Provider-estimated medication adherence level: good  Patient is at risk for Non-Adherence: No  Reasons for non-adherence: no problems identified                                Were doses missed due to medication being on hold? No    TAGRISSO 80 mg tablet (osimertinib)  : 7 days of medicine on hand REFERRAL TO PHARMACIST     Referral to the pharmacist: Not needed      [facility]     Shipping address confirmed in Epic.     Delivery Scheduled: Yes, Expected medication delivery date: 05/08/22.     Medication will be delivered via Same Day Courier to the prescription address in [place].    Darianne Muralles' [facility] Specialty Technician

## 2022-05-08 MED FILL — TAGRISSO 80 MG TABLET: ORAL | 30 days supply | Qty: 30 | Fill #2

## 2022-05-11 ENCOUNTER — Ambulatory Visit
Admit: 2022-05-11 | Discharge: 2022-05-12 | Payer: PRIVATE HEALTH INSURANCE | Attending: Internal Medicine | Primary: Internal Medicine

## 2022-05-11 ENCOUNTER — Other Ambulatory Visit: Admit: 2022-05-11 | Discharge: 2022-05-12 | Payer: PRIVATE HEALTH INSURANCE

## 2022-05-11 ENCOUNTER — Ambulatory Visit: Admit: 2022-05-11 | Discharge: 2022-05-12 | Payer: PRIVATE HEALTH INSURANCE

## 2022-05-11 DIAGNOSIS — C7800 Secondary malignant neoplasm of unspecified lung: Principal | ICD-10-CM

## 2022-05-11 LAB — CBC W/ AUTO DIFF
BASOPHILS ABSOLUTE COUNT: 0 10*9/L (ref 0.0–0.1)
BASOPHILS RELATIVE PERCENT: 1.4 %
EOSINOPHILS ABSOLUTE COUNT: 0.2 10*9/L (ref 0.0–0.5)
EOSINOPHILS RELATIVE PERCENT: 4.8 %
HEMATOCRIT: 32.5 % — ABNORMAL LOW (ref 34.0–44.0)
HEMOGLOBIN: 11.2 g/dL — ABNORMAL LOW (ref 11.3–14.9)
LYMPHOCYTES ABSOLUTE COUNT: 1.3 10*9/L (ref 1.1–3.6)
LYMPHOCYTES RELATIVE PERCENT: 37.9 %
MEAN CORPUSCULAR HEMOGLOBIN CONC: 34.5 g/dL (ref 32.0–36.0)
MEAN CORPUSCULAR HEMOGLOBIN: 31 pg (ref 25.9–32.4)
MEAN CORPUSCULAR VOLUME: 89.9 fL (ref 77.6–95.7)
MEAN PLATELET VOLUME: 7.1 fL (ref 6.8–10.7)
MONOCYTES ABSOLUTE COUNT: 0.4 10*9/L (ref 0.3–0.8)
MONOCYTES RELATIVE PERCENT: 12.3 %
NEUTROPHILS ABSOLUTE COUNT: 1.4 10*9/L — ABNORMAL LOW (ref 1.8–7.8)
NEUTROPHILS RELATIVE PERCENT: 43.6 %
PLATELET COUNT: 248 10*9/L (ref 150–450)
RED BLOOD CELL COUNT: 3.61 10*12/L — ABNORMAL LOW (ref 3.95–5.13)
RED CELL DISTRIBUTION WIDTH: 15.8 % — ABNORMAL HIGH (ref 12.2–15.2)
WBC ADJUSTED: 3.3 10*9/L — ABNORMAL LOW (ref 3.6–11.2)

## 2022-05-11 LAB — COMPREHENSIVE METABOLIC PANEL
ALBUMIN: 3.9 g/dL (ref 3.4–5.0)
ALKALINE PHOSPHATASE: 65 U/L (ref 46–116)
ALT (SGPT): 8 U/L — ABNORMAL LOW (ref 10–49)
ANION GAP: 6 mmol/L (ref 5–14)
AST (SGOT): 22 U/L (ref <=34)
BILIRUBIN TOTAL: 0.4 mg/dL (ref 0.3–1.2)
BLOOD UREA NITROGEN: 12 mg/dL (ref 9–23)
BUN / CREAT RATIO: 15
CALCIUM: 9.9 mg/dL (ref 8.7–10.4)
CHLORIDE: 106 mmol/L (ref 98–107)
CO2: 27 mmol/L (ref 20.0–31.0)
CREATININE: 0.79 mg/dL
EGFR CKD-EPI (2021) FEMALE: 90 mL/min/{1.73_m2} (ref >=60)
GLUCOSE RANDOM: 90 mg/dL (ref 70–179)
POTASSIUM: 4.2 mmol/L (ref 3.4–4.8)
PROTEIN TOTAL: 7.3 g/dL (ref 5.7–8.2)
SODIUM: 139 mmol/L (ref 135–145)

## 2022-05-11 NOTE — Unmapped (Signed)
Thoracic Medical Oncology Clinic    Reason for visit: Evaluation and treatment recommendations for non-small-cell lung cancer    History of Present Illness: Kathleen Galvan is a 51 y.o. year old female with a past medical and oncologic history per below who presents to clinic for evaluation and treatment recommendations for lung adenocarcinoma. Tolerated Osimertinib without issues, eating well, no rash or diarrhea. She lost her job due to staff cuts recently, says she has been tired lately and sometimes gets some seasons affective disorder.     Review of Systems: All systems have been reviewed, pertinent positives and negative per history of present illness, otherwise deemed unremarkable.    Oncologic History: (Stage IVa: [id number], lung adenocarcinoma, PD-L1 0%, EGFR del19 mutation subtype)  - Experiencing progressive left-sided pain for the past year, as well as productive cough  - 12/23/2021 CT chest: 2.4 cm solid spiculated nodule in the apical LUL, left pleural nodules, 1.7 cm left fissural nodule, multiple additional subcentimeter fissural nodules involving the left major fissure, 0.9 cm right fissural nodule, prevascular lymphadenopathy 3.4 cm, bulkly level 5/6 adenopathy  - PET/CT 01/27/2022: FDG avid spiculated left apical nodule 2.4 cm, multiple additional minimally FDG avid solid perifissural pulmonary nodules in the left lung, subpleural nodularity avid. 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 Also has second hand smoke exposure with her husband.    Family History: Reviewed, non-contributory    Physical Examination:  Vitals: BP 130/75  - Pulse 89  - Temp 36.6 ??C (97.8 ??F) (Oral)  - Ht 175.3 cm (5' 9)  - Wt 77.8 kg (171 lb 8 oz)  - SpO2 99%  - BMI 25.33 kg/m??   Performance Status: 1  General: alert and oriented, lucid, no distress and interactive  HEENT: EOMI, no scleral icterus, no oral lesions  Neck: no cervical or supraclavicular lymphadenopathy appreciated, supple  Cardiac: regular rate and rhythm, S1 S2 appreciated, no murmurs or gallops  Pulmonary: no dullness to percussion, good air movement, no crackles or wheezes  Abdomen: soft, non-tender, non-distended, normal bowel sounds, no organomegally  Extremities: no pedal edema, 2+ pulses throughout  Skin: clean, dry and intact, no rash    Imaging Studies: Reviewed per the Radiology interpretation, as well as my own personal assessment. Having a very nice partial response so far.    Pertinent Laboratory: Reviewed    Assessment: Kathleen Galvan is a pleasant 51 y.o. 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 She has an excellent PS and no concerning co-morbidities.     Plan:  1. 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

## 2022-05-26 NOTE — Unmapped (Signed)
[facility] Specialty Pharmacy Refill Coordination Note    Specialty Medication(s) to be Shipped:   Hematology/Oncology: Tagrisso    Other medication(s) to be shipped: No additional medications requested for fill at this time     Kathleen Galvan, DOB: September 21, 1970  Phone: [phone] (home) [phone] (work)      All above HIPAA information was verified with patient.     Was a Nurse, learning disability used for this call? No    Completed refill call assessment today to schedule patient's medication shipment from the [facility] [phone]).  All relevant notes have been reviewed.     Specialty medication(s) and dose(s) confirmed: Regimen is correct and unchanged.   Changes to medications: Kathleen Galvan reports no changes at this time.  Changes to insurance: No  New side effects reported not previously addressed with a pharmacist or physician: Yes - Patient reports sore and tender feet. Patient would not like to speak to the pharmacist today. Their provider is not aware.  Questions for the pharmacist: No    Confirmed patient received a Conservation officer, historic buildings and a Surveyor, mining with first shipment. The patient will receive a drug information handout for each medication shipped and additional FDA Medication Guides as required.       DISEASE/MEDICATION-SPECIFIC INFORMATION        N/A    SPECIALTY MEDICATION ADHERENCE     Medication Adherence    Patient reported X missed doses in the last month: 0  Specialty Medication: Tagrisso 80 mg  Patient is on additional specialty medications: No  Informant: patient                       Were doses missed due to medication being on hold? No    Tagrisso 80 mg: 14 days of medicine on hand        REFERRAL TO PHARMACIST     Referral to the pharmacist: Not needed      [facility]     Shipping address confirmed in Epic.     Delivery Scheduled: Yes, Expected medication delivery date: 06/03/22.     Medication will be delivered via Same Day Courier to the prescription address in Epic [place].    Kathleen Galvan   [facility] Specialty Technician

## 2022-05-31 DIAGNOSIS — C7800 Secondary malignant neoplasm of unspecified lung: Principal | ICD-10-CM

## 2022-06-02 MED FILL — TAGRISSO 80 MG TABLET: ORAL | 30 days supply | Qty: 30 | Fill #3

## 2022-06-27 NOTE — Unmapped (Signed)
[facility] Refill [facility]ination [facility]    Specialty Medication(s) to be Shipped:   Hematology/Oncology: Tagrisso    Other medication(s) to be shipped: No additional medications requested for fill at this time     Kathleen Galvan, DOB: [birth year]  Phone: [phone] (home) [phone] (work)      All above HIPAA information was verified with patient.     Was a Nurse, learning disability used for this call? No    Completed refill call assessment today to schedule patient's medication shipment from the [facility] [phone]).  All relevant notes have been reviewed.     Specialty medication(s) and dose(s) confirmed: Regimen is correct and unchanged.   Changes to medications: Kathleen Galvan reports no changes at this time.  Changes to insurance: No  New side effects reported not previously addressed with a pharmacist or physician: None reported  Questions for the pharmacist: No    Confirmed patient received a Conservation officer, historic buildings and a Surveyor, mining with first shipment. The patient will receive a drug information handout for each medication shipped and additional FDA Medication Guides as required.       DISEASE/MEDICATION-SPECIFIC INFORMATION        N/A    SPECIALTY MEDICATION ADHERENCE     Medication Adherence    Patient reported X missed doses in the last month: 0  Specialty Medication: Tagrisso 80 mg  Patient is on additional specialty medications: No  Informant: patient                       Were doses missed due to medication being on hold? No    Tagrisso 80 mg: 14 days of medicine on hand        REFERRAL TO PHARMACIST     Referral to the pharmacist: Not needed      [facility]     Shipping address confirmed in Epic.     Delivery Scheduled: Yes, Expected medication delivery date: 07/08/22.     Medication will be delivered via Next Day Courier to the prescription address in Epic [place].    Kathleen Galvan M Kathleen Galvan   [facility] Specialty Technician

## 2022-07-07 MED FILL — TAGRISSO 80 MG TABLET: ORAL | 30 days supply | Qty: 30 | Fill #4

## 2022-07-28 NOTE — Unmapped (Signed)
[facility] Refill Coordination Note    Specialty Medication(s) to be Shipped:   Hematology/Oncology: Tagrisso    Other medication(s) to be shipped: No additional medications requested for fill at this time     Kathleen Galvan, DOB: 08/27/1970  Phone: [phone] (home) [phone] (work)      All above HIPAA information was verified with patient.     Was a Nurse, learning disability used for this call? No    Completed refill call assessment today to schedule patient's medication shipment from the [facility] [phone]).  All relevant notes have been reviewed.     Specialty medication(s) and dose(s) confirmed: Regimen is correct and unchanged.   Changes to medications: Kathleen Galvan reports no changes at this time.  Changes to insurance: No  New side effects reported not previously addressed with a pharmacist or physician: None reported  Questions for the pharmacist: No    Confirmed patient received a Conservation officer, historic buildings and a Surveyor, mining with first shipment. The patient will receive a drug information handout for each medication shipped and additional FDA Medication Guides as required.       DISEASE/MEDICATION-SPECIFIC INFORMATION        N/A    SPECIALTY MEDICATION ADHERENCE     Medication Adherence    Patient reported X missed doses in the last month: 0  Specialty Medication: Tagrisso 80 mg  Patient is on additional specialty medications: No  Informant: patient     Were doses missed due to medication being on hold? No    Tagrisso 80 mg: 14 days of medicine on hand        REFERRAL TO PHARMACIST     Referral to the pharmacist: Not needed      [facility]     Shipping address confirmed in Epic.     Delivery Scheduled: Yes, Expected medication delivery date: 08/08/22.     Medication will be delivered via Next Day Courier to the prescription address in Epic [place].    Kathleen Galvan   [facility] Specialty Technician

## 2022-08-02 ENCOUNTER — Other Ambulatory Visit: Payer: Self-pay | Admitting: Family Medicine

## 2022-08-02 DIAGNOSIS — N6312 Unspecified lump in the right breast, upper inner quadrant: Secondary | ICD-10-CM

## 2022-08-06 MED FILL — TAGRISSO 80 MG TABLET: ORAL | 30 days supply | Qty: 30 | Fill #5

## 2022-08-07 ENCOUNTER — Ambulatory Visit
Admission: RE | Admit: 2022-08-07 | Discharge: 2022-08-07 | Disposition: A | Discharge status: Home / Self Care | Payer: Medicaid Other | Source: Ambulatory Visit | Attending: Family Medicine | Admitting: Family Medicine

## 2022-08-07 DIAGNOSIS — N6312 Unspecified lump in the right breast, upper inner quadrant: Secondary | ICD-10-CM | POA: Insufficient documentation

## 2022-08-11 ENCOUNTER — Ambulatory Visit: Admit: 2022-08-11 | Discharge: 2022-08-11 | Payer: PRIVATE HEALTH INSURANCE

## 2022-08-24 NOTE — Unmapped (Signed)
Multidisciplinary Oncology Program Intake Form    Referral Receive Date: 08-23-22    Rapid Access Appointment was offered and declined  NA    Reason for Referral: lump of breast   Initial Consultation: No treatment started for diagnosis  Disease Group: Breast  Specialty: Surgery    Referral Method: Epic    Insurance  Primary Insurance: Medicaid  Authorization Obtained: No    Record Collection: Records  Requested: medical records   Date Requested:  08-24-22  Date Received:  08-24-22  External Facility Name:  [facility]  Phone / Fax:  [facility]    Requested:  breast  Imaging  Date Requested:  08-24-22  Date Received:  pend  External Facility Name:  [facility]  Phone / Fax:  [facility]   Pt. Informed of possible charges associated with imaging? Yes    Requested: N/A Pathology  Date Requested: N/A  Date Received: N/A  External Facility Name: N/A  Phone / Fax: N/A  Pt. Informed of possible charges associated with pathology? NA    COVID Screening  Travel Screening Completed: Yes  Has patient tested positive for COVID in the past 21 days?: No    Close the Loop Communication:    Referring Provider Contacted: Yes  Patient Contacted: Yes    Welcome Packet   Date Sent:  08-24-22  Sent: Email

## 2022-08-27 DIAGNOSIS — C7800 Secondary malignant neoplasm of unspecified lung: Principal | ICD-10-CM

## 2022-08-27 MED ORDER — TAGRISSO 80 MG TABLET
ORAL_TABLET | Freq: Every day | ORAL | 5 refills | 30 days
Start: 2022-08-27 — End: 2023-02-23

## 2022-08-29 NOTE — Unmapped (Signed)
[facility] Refill Coordination Note    Specialty Medication(s) to be Shipped:   Hematology/Oncology: Tagrisso    Other medication(s) to be shipped: No additional medications requested for fill at this time     Kathleen Galvan, DOB: 10-17-1970  Phone: [phone] (home) [phone] (work)      All above HIPAA information was verified with patient.     Was a Nurse, learning disability used for this call? No    Completed refill call assessment today to schedule patient's medication shipment from the [facility] [phone]).  All relevant notes have been reviewed.     Specialty medication(s) and dose(s) confirmed: Regimen is correct and unchanged.   Changes to medications: Joelene reports no changes at this time.  Changes to insurance: No  New side effects reported not previously addressed with a pharmacist or physician: None reported  Questions for the pharmacist: No    Confirmed patient received a Conservation officer, historic buildings and a Surveyor, mining with first shipment. The patient will receive a drug information handout for each medication shipped and additional FDA Medication Guides as required.       DISEASE/MEDICATION-SPECIFIC INFORMATION        N/A    SPECIALTY MEDICATION ADHERENCE     Medication Adherence    Patient reported X missed doses in the last month: 0  Specialty Medication: Tagrisso 80 mg  Patient is on additional specialty medications: No  Informant: patient     Were doses missed due to medication being on hold? No    Tagrisso 80 mg: 10 days of medicine on hand        REFERRAL TO PHARMACIST     Referral to the pharmacist: Not needed      [facility]     Shipping address confirmed in Epic.     Delivery Scheduled: Yes, Expected medication delivery date: 09/05/22.  However, Rx request for refills was sent to the provider as there are none remaining.     Medication will be delivered via Next Day Courier to the prescription address in Epic [place].    [name]   [facility] Specialty Technician

## 2022-08-30 MED ORDER — OSIMERTINIB 80 MG TABLET
ORAL_TABLET | Freq: Every day | ORAL | 5 refills | 30 days | Status: CP
Start: 2022-08-30 — End: 2023-02-26
  Filled 2022-09-04: qty 30, 30d supply, fill #0

## 2022-08-31 ENCOUNTER — Ambulatory Visit: Admit: 2022-08-31 | Discharge: 2022-09-01 | Payer: PRIVATE HEALTH INSURANCE

## 2022-08-31 DIAGNOSIS — N6312 Unspecified lump in the right breast, upper inner quadrant: Principal | ICD-10-CM

## 2022-08-31 DIAGNOSIS — N6310 Unspecified lump in the right breast, unspecified quadrant: Principal | ICD-10-CM

## 2022-08-31 NOTE — Unmapped (Signed)
Patient Name: Kathleen Galvan  Patient Age: 52 y.o.  Encounter Date: 08/31/2022    Referring Physician:   Referring, Unknown Per Patient  No address on file    Primary Care Provider:  Pcp, None Per Patient    Supervising Physician  Dr. Debbrah Alar      Reason for Visit:   Chief Complaint   Patient presents with    New Diagnosis       HPI:    Kathleen Galvan is a 52 y.o. female who is seen in consultation at the request of Referring, Unknown Per * for right breast lump.  The patient's medical record has been reviewed and the patient is interviewed along with her mother who accompanied her today.  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  She is currently being treated for stage IVa lung cancer here at [facility].  Her last screening mammogram was in October and she had that at the normal breast center.  More recently due to the palpable lump she had additional imaging studies those films were submitted here to [facility] and personally reviewed with Dr. Barbette Or wooded and mammography today revealing chunky coarse calcifications consistent with fat necrosis.  No biopsy is recommended.    Past medical history is otherwise notable for GERD and depression.    Review of Systems:  Is unremarkable for constitutional complaints all 10 systems    Reproductive History:  Patient is postmenopausal she is a G1, P1    Medical History:  Past Medical History:   Diagnosis Date    Breast cancer (CMS-HCC) 2014       Surgical History:  Past Surgical History:   Procedure Laterality Date    BREAST LUMPECTOMY  2015    CESAREAN SECTION      dandc      GANGLION CYST EXCISION      HAND SURGERY  2014    PR THORACOSCOPY WITH BIOPSYIES OF PLEURA Left 02/16/2022    Procedure: THORACOSCOPY; WITH BIOPSY(IES) OF PLEURA;  Surgeon: Monico Hoar, MD;  Location: MAIN OR [place];  Service: Thoracic    PR THORACOSCOPY,DX MEDIAST W BX Left 02/16/2022    Procedure: THORACOSCOPY DX (SEP PRO); MEDIASTIN SPACE W/BX;  Surgeon: Monico Hoar, MD;  Location: MAIN OR [place];  Service: Thoracic    TONSILLECTOMY         Family History:  Family History   Problem Relation Age of Onset    Cancer Paternal Uncle         prostate    Cancer Paternal Uncle         liver    Alzheimer's disease Paternal Grandmother     Diabetes Paternal Grandfather     Lung cancer Neg Hx        Social History:  Tobacco use:   Social History     Tobacco Use   Smoking Status Former    Current packs/day: 0.00    Average packs/day: 0.5 packs/day for 25.0 years (12.5 ttl pk-yrs)    Types: Cigarettes    Start date: 38    Quit date: 2020    Years since quitting: 4.2   Smokeless Tobacco Not on file     Alcohol use:   Social History     Substance and Sexual Activity   Alcohol Use Not Currently    Comment: used to  drink regularly; quit     Drug use:   Social History     Substance and Sexual Activity   Drug Use Not Currently     Living situation: 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    Medications:     Current Outpatient Medications:     ibuprofen (MOTRIN) 400 MG tablet, Take 1 tablet (400 mg total) by mouth every six (6) hours as needed for pain., Disp: , Rfl:     ondansetron (ZOFRAN) 4 MG tablet, Take 1 tablet (4 mg total) by mouth every eight (8) hours as needed for nausea., Disp: 30 tablet, Rfl: 1    osimertinib (TAGRISSO) 80 mg tablet, Take 1 tablet (80 mg total) by mouth daily., Disp: 30 tablet, Rfl: 5    Allergies:  is allergic to codeine hcl and latex, natural rubber.          Exam:  BSA: 1.92 meters squared  BP 119/64  - Pulse 89  - Temp 35.8 ??C (96.5 ??F) (Temporal)  - Resp 16  - Ht 175.3 cm (5' 9)  - Wt 75.7 kg (166 lb 12.8 oz)  - SpO2 100%  - BMI 24.63 kg/m??   Pain Assessment: Pain scale 0    General Appearance:  No acute distress, well appearing and well nourished.   Head:  Normocephalic, atraumatic.   Eyes:  Conjuctiva and lids appear normal. Pupils equal and round,   sclera anicteric.   Ears:  Overall appearance normal with no scars, lesions or               masses.  Hearing is grossly normal.   Nose: Not examined secondary to face mask for covid   Throat: Not examined secondary to face mask for covid   Breast: Breast exam has fairly symmetric breasts with everted nipples.  The breasts are ptotic.  Right breast has a well-healed surgical scar and a vague palpable density that is clinically not suspicious.   Axilla No adenopathy in the axilla   Neck: Neck is supple trachea midline no JVD is supraclavicular adenopathy   Pulmonary:    Normal respiratory effort   Cardiovascular:  Regular rate and rhythm per vital signs   Musculoskeletal: Normal gait.  Extremities without clubbing, cyanosis, or           edema.   Psychiatric: Judgement and insight appropriate.  Oriented to person,         place,  and time.       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  I will see her on an as-needed basis for new problems and I recommended screening mammogram to resume in October of this year.      The note was transcribed by dragon and may contain errors in spelling

## 2022-09-27 NOTE — Unmapped (Signed)
[facility] Pharmacy Clinical Assessment & Refill Coordination Note    Kathleen Galvan, DOB: 03-Mar-1971  Phone: [phone] (home) [phone] (work)    All above HIPAA information was verified with patient.     Was a Nurse, learning disability used for this call? No    Specialty Medication(s):   Hematology/Oncology: Tagrisso     Current Outpatient Medications   Medication Sig Dispense Refill    ibuprofen (MOTRIN) 400 MG tablet Take 1 tablet (400 mg total) by mouth every six (6) hours as needed for pain.      ondansetron (ZOFRAN) 4 MG tablet Take 1 tablet (4 mg total) by mouth every eight (8) hours as needed for nausea. 30 tablet 1    osimertinib (TAGRISSO) 80 mg tablet Take 1 tablet (80 mg total) by mouth daily. 30 tablet 5     No current facility-administered medications for this visit.        Changes to medications: Pranika reports no changes at this time.    Allergies   Allergen Reactions    Codeine Hcl Nausea Only    Latex, Natural Rubber Itching and Rash       Changes to allergies: No    SPECIALTY MEDICATION ADHERENCE     Tagrisso 80 mg: unsure how many days of medicine on hand - not at home.  Last dispensed on 09/04/22 (patient estimates about 7-10 day supply on hand)    Are there any concerns with adherence? No    Adherence counseling provided? Not needed    Patient-Reported Symptoms Tracker for Cancer Patients on Oral Chemotherapy     Oral chemotherapy medication name(s):    Dose and frequency:    Oral Chemotherapy Start Date:    Baseline?    Clinic(s) visited:      Symptom Grouping Question Patient Response   Digestion and Eating Have you felt sick to your stomach?      Had diarrhea?      Constipated?      Not wanting to eat?      Comments      Sleep and Pain Felt very tired even after you rest?      Pain due to cancer medication or cancer?      Comments     Other Side Effects Numbness or tingling in hands and/or feet?      Felt short of breath?      Mouth or throat Sores?      Rash? Palmar-plantar erythrodysesthesia syndrome?      Rash - acneiform?      Rash - maculo-papular?      How many days over the past month did your cancer medication or cancer keep you from your normal activities?  Write in number of days, 0-30:       Other side effects or things you would like to discuss?      Comments?     Adherence  In the last 30 days, on how many days did you miss at least one dose of any of your [drug name]? Write in number of days, 0-30:       What reasons are you having trouble taking your medication [pharmacist: check all that apply]? Specify chemotherapy cycle:             Comments:        Comments       Optional Symptom Tracking Comments:      CLINICAL MANAGEMENT AND INTERVENTION      Clinical Benefit  Assessment:    Do you feel the medicine is effective or helping your condition? Yes    Clinical Benefit counseling provided? Not needed    Acute Infection Status:    Acute infections noted within Epic:  No active infections    Patient reported infection: None    Therapy Appropriateness:    Is therapy appropriate and patient progressing towards therapeutic goals? Yes, therapy is appropriate and should be continued    DISEASE/MEDICATION-SPECIFIC INFORMATION      N/A    Is the patient receiving adequate infection prevention treatment? Not applicable    Does the patient have adequate nutritional support? Not applicable    PATIENT SPECIFIC NEEDS     Does the patient have any physical, cognitive, or cultural barriers? No    Is the patient high risk? No    Did the patient require a clinical intervention? No    Does the patient require physician intervention or other additional services (i.e., nutrition, smoking cessation, social work)? No    SOCIAL DETERMINANTS OF HEALTH     At the [facility], we have learned that life circumstances - like trouble affording food, housing, utilities, or transportation can affect the health of many of our patients.   That is why we wanted to ask: are you currently experiencing any life circumstances that are negatively impacting your health and/or quality of life? Patient declined to answer    Social Determinants of Health     Financial Resource Strain: Not on file   Internet Connectivity: Not on file   Food Insecurity: Not on file   Tobacco Use: Medium Risk (08/31/2022)    Patient History     Smoking Tobacco Use: Former     Smokeless Tobacco Use: Unknown     Passive Exposure: Not on file   Housing/Utilities: Not on file   Alcohol Use: Not on file   Transportation Needs: Not on file   Substance Use: Not on file   Health Literacy: Not on file   Physical Activity: Not on file   Interpersonal Safety: Not on file   Stress: Not on file   Intimate Partner Violence: Not on file   Depression: Not on file   Social Connections: Not on file     Would you be willing to receive help with any of the needs that you have identified today? Not applicable       SHIPPING     Specialty Medication(s) to be Shipped:   Hematology/Oncology: Tagrisso    Other medication(s) to be shipped: No additional medications requested for fill at this time     Changes to insurance: No    Delivery Scheduled: Yes, Expected medication delivery date: 10/03/22.     Medication will be delivered via Next Day Courier to the confirmed prescription address in [facility].    The patient will receive a drug information handout for each medication shipped and additional FDA Medication Guides as required.  Verified that patient has previously received a Conservation officer, historic buildings and a Surveyor, mining.    The patient or caregiver noted above participated in the development of this care plan and knows that they can request review of or adjustments to the care plan at any time.      All of the patient's questions and concerns have been addressed.    Kermit Balo, [facility]   [facility] Specialty Pharmacist

## 2022-09-28 ENCOUNTER — Ambulatory Visit
Admit: 2022-09-28 | Discharge: 2022-09-29 | Payer: PRIVATE HEALTH INSURANCE | Attending: Internal Medicine | Primary: Internal Medicine

## 2022-09-28 ENCOUNTER — Other Ambulatory Visit: Admit: 2022-09-28 | Discharge: 2022-09-29 | Payer: PRIVATE HEALTH INSURANCE

## 2022-09-28 DIAGNOSIS — C7800 Secondary malignant neoplasm of unspecified lung: Principal | ICD-10-CM

## 2022-09-28 LAB — COMPREHENSIVE METABOLIC PANEL
ALBUMIN: 4 g/dL (ref 3.4–5.0)
ALKALINE PHOSPHATASE: 70 U/L (ref 46–116)
ALT (SGPT): 10 U/L (ref 10–49)
ANION GAP: 6 mmol/L (ref 5–14)
AST (SGOT): 30 U/L (ref <=34)
BILIRUBIN TOTAL: 0.4 mg/dL (ref 0.3–1.2)
BLOOD UREA NITROGEN: 13 mg/dL (ref 9–23)
BUN / CREAT RATIO: 16
CALCIUM: 9.3 mg/dL (ref 8.7–10.4)
CHLORIDE: 109 mmol/L — ABNORMAL HIGH (ref 98–107)
CO2: 25 mmol/L (ref 20.0–31.0)
CREATININE: 0.83 mg/dL
EGFR CKD-EPI (2021) FEMALE: 85 mL/min/{1.73_m2} (ref >=60)
GLUCOSE RANDOM: 87 mg/dL (ref 70–179)
POTASSIUM: 4.4 mmol/L (ref 3.4–4.8)
PROTEIN TOTAL: 7 g/dL (ref 5.7–8.2)
SODIUM: 140 mmol/L (ref 135–145)

## 2022-09-28 LAB — CBC W/ AUTO DIFF
BASOPHILS ABSOLUTE COUNT: 0 10*9/L (ref 0.0–0.1)
BASOPHILS RELATIVE PERCENT: 1.2 %
EOSINOPHILS ABSOLUTE COUNT: 0.1 10*9/L (ref 0.0–0.5)
EOSINOPHILS RELATIVE PERCENT: 4.9 %
HEMATOCRIT: 34 % (ref 34.0–44.0)
HEMOGLOBIN: 11.7 g/dL (ref 11.3–14.9)
LYMPHOCYTES ABSOLUTE COUNT: 1.1 10*9/L (ref 1.1–3.6)
LYMPHOCYTES RELATIVE PERCENT: 42.3 %
MEAN CORPUSCULAR HEMOGLOBIN CONC: 34.6 g/dL (ref 32.0–36.0)
MEAN CORPUSCULAR HEMOGLOBIN: 31.2 pg (ref 25.9–32.4)
MEAN CORPUSCULAR VOLUME: 90.2 fL (ref 77.6–95.7)
MEAN PLATELET VOLUME: 7.6 fL (ref 6.8–10.7)
MONOCYTES ABSOLUTE COUNT: 0.3 10*9/L (ref 0.3–0.8)
MONOCYTES RELATIVE PERCENT: 11 %
NEUTROPHILS ABSOLUTE COUNT: 1.1 10*9/L — ABNORMAL LOW (ref 1.8–7.8)
NEUTROPHILS RELATIVE PERCENT: 40.6 %
PLATELET COUNT: 233 10*9/L (ref 150–450)
RED BLOOD CELL COUNT: 3.77 10*12/L — ABNORMAL LOW (ref 3.95–5.13)
RED CELL DISTRIBUTION WIDTH: 16.1 % — ABNORMAL HIGH (ref 12.2–15.2)
WBC ADJUSTED: 2.7 10*9/L — ABNORMAL LOW (ref 3.6–11.2)

## 2022-09-29 NOTE — Unmapped (Signed)
Thoracic Medical Oncology Clinic    Reason for visit: Evaluation and treatment recommendations for non-small-cell lung cancer    History of Present Illness: Kathleen Galvan is a 52 y.o. year old female with a past medical and oncologic history per below who presents to clinic for evaluation and treatment recommendations for lung adenocarcinoma. Overall feeling well, no new issues, still with residual chest pain on deep inspiration.      Review of Systems: All systems have been reviewed, pertinent positives and negative per history of present illness, otherwise deemed unremarkable.    Oncologic History: (Stage IVa: [id number], lung adenocarcinoma, PD-L1 0%, EGFR del19 mutation subtype)  - Experiencing progressive left-sided pain for the past year, as well as productive cough  - 12/23/2021 CT chest: 2.4 cm solid spiculated nodule in the apical LUL, left pleural nodules, 1.7 cm left fissural nodule, multiple additional subcentimeter fissural nodules involving the left major fissure, 0.9 cm right fissural nodule, prevascular lymphadenopathy 3.4 cm, bulkly level 5/6 adenopathy  - PET/CT 01/27/2022: FDG avid spiculated left apical nodule 2.4 cm, multiple additional minimally FDG avid solid perifissural pulmonary nodules in the left lung, subpleural nodularity avid. Intensely FDG avid anterior mediastinal lymph node measuring up to 3.1 cm  - Brain MRI 01/27/2022: no significant abnormalities  - MTOP discussion, pleural and mediastinal sampling was recommended  - 02/16/22 Left VATS: left pleura and station 5, lung adenocarcinoma  - 03/14/22: Started Osimertinib 80mg  every day  - 05/11/22: CT Chest showing very nice partial response  - 08/11/22: CT Chest stable    Allergies: Reviewed    Medications: Reviewed    Past Medical History:  Lung Cancer  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 Also has second hand smoke exposure with her husband.    Family History: Reviewed, non-contributory    Physical Examination:  Vitals: BP 127/83  - Pulse 69  - Temp 36.7 ??C (98 ??F) (Oral)  - Ht 175.3 cm (5' 9)  - Wt 77.6 kg (171 lb)  - SpO2 100%  - BMI 25.25 kg/m??   Performance Status: 1  General: alert and oriented, lucid, no distress and interactive  HEENT: EOMI, no scleral icterus, no oral lesions  Neck: no cervical or supraclavicular lymphadenopathy appreciated, supple  Cardiac: regular rate and rhythm, S1 S2 appreciated, no murmurs or gallops  Pulmonary: no dullness to percussion, good air movement, no crackles or wheezes  Abdomen: soft, non-tender, non-distended, normal bowel sounds, no organomegally  Extremities: no pedal edema, 2+ pulses throughout  Skin: clean, dry and intact, no rash    Imaging Studies: Reviewed per the Radiology interpretation, as well as my own personal assessment. Having a very nice partial response so far.    Pertinent Laboratory: Reviewed    Assessment: Kathleen Galvan is a pleasant 52 y.o. 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 She has an excellent PS and no concerning co-morbidities.     Plan:  1. 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

## 2022-10-02 MED FILL — TAGRISSO 80 MG TABLET: ORAL | 30 days supply | Qty: 30 | Fill #1

## 2022-10-20 NOTE — Unmapped (Signed)
[facility] Pharmacy Refill Coordination Note    Specialty Medication(s) to be Shipped:   Hematology/Oncology: Tagrisso    Other medication(s) to be shipped: No additional medications requested for fill at this time     Kathleen Galvan, DOB: 1970/08/19  Phone: [phone] (home) [phone] (work)      All above HIPAA information was verified with patient.     Was a Nurse, learning disability used for this call? No    Completed refill call assessment today to schedule patient's medication shipment from the [facility] [phone]).  All relevant notes have been reviewed.     Specialty medication(s) and dose(s) confirmed: Regimen is correct and unchanged.   Changes to medications: [name] reports no changes at this time.  Changes to insurance: No  New side effects reported not previously addressed with a pharmacist or physician: None reported  Questions for the pharmacist: No    Confirmed patient received a Conservation officer, historic buildings and a Surveyor, mining with first shipment. The patient will receive a drug information handout for each medication shipped and additional FDA Medication Guides as required.       DISEASE/MEDICATION-SPECIFIC INFORMATION        N/A    SPECIALTY MEDICATION ADHERENCE     Medication Adherence    Patient reported X missed doses in the last month: 0  Specialty Medication: Tagrisso 80 mg  Patient is on additional specialty medications: No  Informant: patient     Were doses missed due to medication being on hold? No    Tagrisso 80 mg: 14 days of medicine on hand        REFERRAL TO PHARMACIST     Referral to the pharmacist: Not needed      [facility]     Shipping address confirmed in Epic.     Delivery Scheduled: Yes, Expected medication delivery date: 11/02/22.     Medication will be delivered via Next Day Courier to the prescription address in Epic [place].    Kathleen Galvan   [facility] Specialty Technician

## 2022-11-01 MED FILL — TAGRISSO 80 MG TABLET: ORAL | 30 days supply | Qty: 30 | Fill #2

## 2022-11-22 NOTE — Unmapped (Signed)
[facility] Special[facility] Pharmacy Refill Coordination Note    Specialty Medication(s) to be Shipped:   Hematology/Oncology: Tagrisso    Other medication(s) to be shipped: No additional medications requested for fill at this time     Kathleen Galvan, DOB: 1970-10-11  Phone: [phone] (home) [phone] (work)      All above HIPAA information was verified with patient.     Was a Nurse, learning disability used for this call? No    Completed refill call assessment today to schedule patient's medication shipment from the [facility] [phone]).  All relevant notes have been reviewed.     Specialty medication(s) and dose(s) confirmed: Regimen is correct and unchanged.   Changes to medications: Keishana reports no changes at this time.  Changes to insurance: No  New side effects reported not previously addressed with a pharmacist or physician: None reported  Questions for the pharmacist: No    Confirmed patient received a Conservation officer, historic buildings and a Surveyor, mining with first shipment. The patient will receive a drug information handout for each medication shipped and additional FDA Medication Guides as required.       DISEASE/MEDICATION-SPECIFIC INFORMATION        N/A    SPECIALTY MEDICATION ADHERENCE     Medication Adherence    Patient reported X missed doses in the last month: 0  Specialty Medication: Tagrisso 80 mg  Patient is on additional specialty medications: No  Informant: patient     Were doses missed due to medication being on hold? No    Tagrisso 80 mg: 18 days of medicine on hand        REFERRAL TO PHARMACIST     Referral to the pharmacist: Not needed      [facility]     Shipping address confirmed in Epic.     Delivery Scheduled: Yes, Expected medication delivery date: 12/06/22.     Medication will be delivered via Next Day Courier to the prescription address in Epic [place].    Kathleen Galvan M Kathleen Galvan   [facility] Specialty Technician

## 2022-12-05 MED FILL — TAGRISSO 80 MG TABLET: ORAL | 30 days supply | Qty: 30 | Fill #3

## 2022-12-26 NOTE — Unmapped (Signed)
[facility] Special[facility] Pharmacy Refill Coordination Note    Specialty Medication(s) to be Shipped:   Hematology/Oncology: Tagrisso    Other medication(s) to be shipped: No additional medications requested for fill at this time     Kathleen Galvan, DOB: Dec 23, 1970  Phone: [phone] (home) [phone] (work)      All above HIPAA information was verified with patient.     Was a Nurse, learning disability used for this call? No    Completed refill call assessment today to schedule patient's medication shipment from the [facility] [phone]).  All relevant notes have been reviewed.     Specialty medication(s) and dose(s) confirmed: Regimen is correct and unchanged.   Changes to medications: Kathleen Galvan reports no changes at this time.  Changes to insurance: No  New side effects reported not previously addressed with a pharmacist or physician: Yes - Patient reports swollen and red mouth and tongue to the point she couldn't eat due to it. Patient would not like to speak to the pharmacist today. Their provider is not aware.  Questions for the pharmacist: No    Confirmed patient received a Conservation officer, historic buildings and a Surveyor, mining with first shipment. The patient will receive a drug information handout for each medication shipped and additional FDA Medication Guides as required.       DISEASE/MEDICATION-SPECIFIC INFORMATION        N/A    SPECIALTY MEDICATION ADHERENCE     Medication Adherence    Patient reported X missed doses in the last month: 0  Specialty Medication: Tagrisso 80 mg  Patient is on additional specialty medications: No  Informant: patient     Were doses missed due to medication being on hold? No    Tagrisso 80 mg: 14 days of medicine on hand        REFERRAL TO PHARMACIST     Referral to the pharmacist: Not needed      [facility]     Shipping address confirmed in Epic.     Delivery Scheduled: Yes, Expected medication delivery date: 01/05/23.     Medication will be delivered via Next Day Courier to the prescription address in Epic [place].    [name]   [facility] Specialty Technician

## 2023-01-04 MED FILL — TAGRISSO 80 MG TABLET: ORAL | 30 days supply | Qty: 30 | Fill #4

## 2023-01-23 NOTE — Unmapped (Signed)
[facility] Refill Coordination Note    Kathleen Galvan, DOB: [birth year]  Phone: [phone] (home) [phone] (work)      All above HIPAA information was verified with patient.         01/23/2023    12:12 PM   Specialty Rx Medication Refill Questionnaire   Which Medications would you like refilled and shipped? Tagrisso , I have 16 left.   Please list all current allergies: Codeine   Have you missed any doses in the last 30 days? No   Have you had any changes to your medication(s) since your last refill? No   How many days remaining of each medication do you have at home? 16   Have you experienced any side effects in the last 30 days? No   Please enter the full address (street address, city, state, zip code) where you would like your medication(s) to be delivered to. [address] , [place] [address]   Please specify on which day you would like your medication(s) to arrive. Note: if you need your medication(s) within 3 days, please call the pharmacy to schedule your order at [phone]  02/05/2023   Has your insurance changed since your last refill? No   Would you like a pharmacist to call you to discuss your medication(s)? No   Do you require a signature for your package? (Note: if we are billing Medicare Part B or your order contains a controlled substance, we will require a signature) No         Completed refill call assessment today to schedule patient's medication shipment from the [facility] [phone]).  All relevant notes have been reviewed.       Confirmed patient received a Conservation officer, historic buildings and a Surveyor, mining with first shipment. The patient will receive a drug information handout for each medication shipped and additional FDA Medication Guides as required.         REFERRAL TO PHARMACIST     Referral to the pharmacist: Not needed      [facility]     Shipping address confirmed in Epic.     Delivery Scheduled: Yes, Expected medication delivery date: 02/06/23.  Pt approved delivery for 02/06/23    Medication will be delivered via Same Day Courier to the prescription address in Epic [place].    [name]   [facility] Specialty Technician

## 2023-01-29 ENCOUNTER — Other Ambulatory Visit: Payer: Self-pay | Admitting: Family Medicine

## 2023-01-29 DIAGNOSIS — Z1231 Encounter for screening mammogram for malignant neoplasm of breast: Secondary | ICD-10-CM

## 2023-02-01 ENCOUNTER — Ambulatory Visit: Admit: 2023-02-01 | Discharge: 2023-02-01 | Payer: PRIVATE HEALTH INSURANCE

## 2023-02-01 ENCOUNTER — Ambulatory Visit
Admit: 2023-02-01 | Discharge: 2023-02-01 | Payer: PRIVATE HEALTH INSURANCE | Attending: Internal Medicine | Primary: Internal Medicine

## 2023-02-01 ENCOUNTER — Other Ambulatory Visit: Admit: 2023-02-01 | Discharge: 2023-02-01 | Payer: PRIVATE HEALTH INSURANCE

## 2023-02-01 DIAGNOSIS — C7800 Secondary malignant neoplasm of unspecified lung: Principal | ICD-10-CM

## 2023-02-01 LAB — CBC W/ AUTO DIFF
BASOPHILS ABSOLUTE COUNT: 0 10*9/L (ref 0.0–0.1)
BASOPHILS RELATIVE PERCENT: 1 %
EOSINOPHILS ABSOLUTE COUNT: 0.2 10*9/L (ref 0.0–0.5)
EOSINOPHILS RELATIVE PERCENT: 5.6 %
HEMATOCRIT: 32.5 % — ABNORMAL LOW (ref 34.0–44.0)
HEMOGLOBIN: 11 g/dL — ABNORMAL LOW (ref 11.3–14.9)
LYMPHOCYTES ABSOLUTE COUNT: 1.3 10*9/L (ref 1.1–3.6)
LYMPHOCYTES RELATIVE PERCENT: 41.6 %
MEAN CORPUSCULAR HEMOGLOBIN CONC: 33.7 g/dL (ref 32.0–36.0)
MEAN CORPUSCULAR HEMOGLOBIN: 30.7 pg (ref 25.9–32.4)
MEAN CORPUSCULAR VOLUME: 91.1 fL (ref 77.6–95.7)
MEAN PLATELET VOLUME: 7.2 fL (ref 6.8–10.7)
MONOCYTES ABSOLUTE COUNT: 0.4 10*9/L (ref 0.3–0.8)
MONOCYTES RELATIVE PERCENT: 13.1 %
NEUTROPHILS ABSOLUTE COUNT: 1.2 10*9/L — ABNORMAL LOW (ref 1.8–7.8)
NEUTROPHILS RELATIVE PERCENT: 38.7 %
PLATELET COUNT: 223 10*9/L (ref 150–450)
RED BLOOD CELL COUNT: 3.57 10*12/L — ABNORMAL LOW (ref 3.95–5.13)
RED CELL DISTRIBUTION WIDTH: 16.2 % — ABNORMAL HIGH (ref 12.2–15.2)
WBC ADJUSTED: 3 10*9/L — ABNORMAL LOW (ref 3.6–11.2)

## 2023-02-01 LAB — COMPREHENSIVE METABOLIC PANEL
ALBUMIN: 4.1 g/dL (ref 3.4–5.0)
ALKALINE PHOSPHATASE: 68 U/L (ref 46–116)
ALT (SGPT): 9 U/L — ABNORMAL LOW (ref 10–49)
ANION GAP: 5 mmol/L (ref 5–14)
AST (SGOT): 27 U/L (ref <=34)
BILIRUBIN TOTAL: 0.4 mg/dL (ref 0.3–1.2)
BLOOD UREA NITROGEN: 14 mg/dL (ref 9–23)
BUN / CREAT RATIO: 14
CALCIUM: 9.8 mg/dL (ref 8.7–10.4)
CHLORIDE: 110 mmol/L — ABNORMAL HIGH (ref 98–107)
CO2: 27 mmol/L (ref 20.0–31.0)
CREATININE: 0.98 mg/dL
EGFR CKD-EPI (2021) FEMALE: 70 mL/min/{1.73_m2} (ref >=60)
GLUCOSE RANDOM: 93 mg/dL (ref 70–179)
POTASSIUM: 4.7 mmol/L (ref 3.4–4.8)
PROTEIN TOTAL: 6.7 g/dL (ref 5.7–8.2)
SODIUM: 142 mmol/L (ref 135–145)

## 2023-02-02 NOTE — Unmapped (Signed)
[facility]cology [facility]    Reason for visit: Evaluation and treatment recommendations for non-small-cell lung cancer    History of Present Illness: Ms. Kathleen Galvan is a 52 y.o. year old female with a past medical and oncologic history per below who presents to clinic for evaluation and treatment recommendations for lung adenocarcinoma. Doing great, no new issues currently. However did have some oral ulcers that resolved after a few weeks, not sure what the cause was.     Review of Systems: All systems have been reviewed, pertinent positives and negative per history of present illness, otherwise deemed unremarkable.    Oncologic History: (Stage IVa: [id number], lung adenocarcinoma, PD-L1 0%, EGFR del19 mutation subtype)  - Experiencing progressive left-sided pain for the past year, as well as productive cough  - 12/23/2021 CT chest: 2.4 cm solid spiculated nodule in the apical LUL, left pleural nodules, 1.7 cm left fissural nodule, multiple additional subcentimeter fissural nodules involving the left major fissure, 0.9 cm right fissural nodule, prevascular lymphadenopathy 3.4 cm, bulkly level 5/6 adenopathy  - PET/CT 01/27/2022: FDG avid spiculated left apical nodule 2.4 cm, multiple additional minimally FDG avid solid perifissural pulmonary nodules in the left lung, subpleural nodularity avid. 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 Also has second hand smoke exposure with her husband.    Family History: Reviewed, non-contributory    Physical Examination:  Vitals: BP 131/68  - Pulse 72  - Temp 36.2 ??C (97.2 ??F) (Temporal)  - Ht 175.3 cm (5' 9)  - Wt 71 kg (156 lb 9.6 oz)  - SpO2 99%  - BMI 23.13 kg/m??   Performance Status: 1  General: alert and oriented, lucid, no distress and interactive  HEENT: EOMI, no scleral icterus, no oral lesions  Neck: no cervical or supraclavicular lymphadenopathy appreciated, supple  Cardiac: regular rate and rhythm, S1 S2 appreciated, no murmurs or gallops  Pulmonary: no dullness to percussion, good air movement, no crackles or wheezes  Abdomen: soft, non-tender, non-distended, normal bowel sounds, no organomegally  Extremities: no pedal edema, 2+ pulses throughout  Skin: clean, dry and intact, no rash    Imaging Studies: Reviewed per the Radiology interpretation, as well as my own personal assessment. Stable disease    Pertinent Laboratory: Reviewed    Assessment: Kathleen Galvan is a pleasant 52 y.o. 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 She has an excellent PS and no concerning co-morbidities.     Plan:  1. 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

## 2023-02-05 MED FILL — TAGRISSO 80 MG TABLET: ORAL | 30 days supply | Qty: 30 | Fill #5

## 2023-02-22 DIAGNOSIS — C7800 Secondary malignant neoplasm of unspecified lung: Principal | ICD-10-CM

## 2023-02-22 MED ORDER — OSIMERTINIB 80 MG TABLET
ORAL_TABLET | Freq: Every day | ORAL | 5 refills | 30 days
Start: 2023-02-22 — End: 2023-08-21

## 2023-02-23 NOTE — Unmapped (Signed)
Please refill if appropriate.     Most recent clinic visit: 02/01/2023  Next clinic visit: 05/24/2023

## 2023-03-01 DIAGNOSIS — C7800 Secondary malignant neoplasm of unspecified lung: Principal | ICD-10-CM

## 2023-03-01 MED ORDER — OSIMERTINIB 80 MG TABLET
ORAL_TABLET | Freq: Every day | ORAL | 5 refills | 30 days | Status: CP
Start: 2023-03-01 — End: 2023-08-28
  Filled 2023-03-09: qty 30, 30d supply, fill #0

## 2023-03-01 NOTE — Unmapped (Signed)
[facility] Special[facility] and Home Delivery Pharmacy Clinical Assessment & Refill Coordination Note    Kathleen Galvan, DOB: Mar 02, 1971  Phone: [phone] (home) [phone] (work)    All above HIPAA information was verified with patient.     Was a Nurse, learning disability used for this call? No    Specialty Medication(s):   Hematology/Oncology: Tagrisso     Current Outpatient Medications   Medication Sig Dispense Refill    ibuprofen (MOTRIN) 400 MG tablet Take 1 tablet (400 mg total) by mouth every six (6) hours as needed for pain.      ondansetron (ZOFRAN) 4 MG tablet Take 1 tablet (4 mg total) by mouth every eight (8) hours as needed for nausea. 30 tablet 1    osimertinib (TAGRISSO) 80 mg tablet Take 1 tablet (80 mg total) by mouth daily. 30 tablet 5     No current facility-administered medications for this visit.        Changes to medications: Daysia reports no changes at this time.    Allergies   Allergen Reactions    Codeine Hcl Nausea Only    Latex, Natural Rubber Itching and Rash       Changes to allergies: No    SPECIALTY MEDICATION ADHERENCE     Tagrisso 80 mg: 9 days of medicine on hand     Are there any concerns with adherence? No    Adherence counseling provided? Not needed    Patient-Reported Symptoms Tracker for Cancer Patients on Oral Chemotherapy     Oral chemotherapy medication name(s): Tagrisso  Dose and frequency: 80 mg once daily  Oral Chemotherapy Start Date:    Baseline? No  Clinic(s) visited: Thoracic    Symptom Grouping Question Patient Response   Digestion and Eating Have you felt sick to your stomach? Denies    Had diarrhea? Denies    Constipated? Denies    Not wanting to eat? Denies    Comments      Sleep and Pain Felt very tired even after you rest? Denies    Pain due to cancer medication or cancer? Denies    Comments     Other Side Effects Numbness or tingling in hands and/or feet? Denies    Felt short of breath? Denies    Mouth or throat Sores? Denies    Rash? Denies    Palmar-plantar erythrodysesthesia syndrome?      Rash - acneiform?      Rash - maculo-papular?      How many days over the past month did your cancer medication or cancer keep you from your normal activities?  Write in number of days, 0-30:  0    Other side effects or things you would like to discuss?      Comments?     Adherence  In the last 30 days, on how many days did you miss at least one dose of any of your [drug name]? Write in number of days, 0-30:  0    What reasons are you having trouble taking your medication [pharmacist: check all that apply]? Specify chemotherapy cycle:        No problems identified    Comments:        Comments       Optional Symptom Tracking Comments: No issues concerns experienced    CLINICAL MANAGEMENT AND INTERVENTION      Clinical Benefit Assessment:    Do you feel the medicine is effective or helping your condition? Yes    Clinical Benefit counseling provided?  Not needed    Acute Infection Status:    Acute infections noted within Epic:  No active infections    Patient reported infection: None    Therapy Appropriateness:    Is therapy appropriate based on current medication list, adverse reactions, adherence, clinical benefit and progress toward achieving therapeutic goals?  Yes, therapy is appropriate and should be continued    DISEASE/MEDICATION-SPECIFIC INFORMATION      N/A    Is the patient receiving adequate infection prevention treatment? Not applicable    Does the patient have adequate nutritional support? Not applicable    PATIENT SPECIFIC NEEDS     Does the patient have any physical, cognitive, or cultural barriers? No    Is the patient high risk? No    Did the patient require a clinical intervention? No    Does the patient require physician intervention or other additional services (i.e., nutrition, smoking cessation, social work)? No    SOCIAL DETERMINANTS OF HEALTH     At the [facility], we have learned that life circumstances - like trouble affording food, housing, utilities, or transportation can affect the health of many of our patients.   That is why we wanted to ask: are you currently experiencing any life circumstances that are negatively impacting your health and/or quality of life? Patient declined to answer    Social Determinants of Health     Food Insecurity: Not on file   Internet Connectivity: Not on file   Housing/Utilities: Not on file   Tobacco Use: Medium Risk (08/31/2022)    Patient History     Smoking Tobacco Use: Former     Smokeless Tobacco Use: Unknown     Passive Exposure: Not on file   Transportation Needs: Not on file   Alcohol Use: Not on file   Interpersonal Safety: Unknown (03/01/2023)    Interpersonal Safety     Unsafe Where You Currently Live: Not on file     Physically Hurt by Anyone: Not on file     Abused by Anyone: Not on file   Physical Activity: Not on file   Intimate Partner Violence: Not on file   Stress: Not on file   Substance Use: Not on file   Social Connections: Not on file   Financial Resource Strain: Not on file   Depression: Not on file   Health Literacy: Not on file       Would you be willing to receive help with any of the needs that you have identified today? Not applicable       SHIPPING     Specialty Medication(s) to be Shipped:   Hematology/Oncology: Tagrisso    Other medication(s) to be shipped: No additional medications requested for fill at this time     Changes to insurance: No    Delivery Scheduled: Yes, Expected medication delivery date: 03/08/23.     Medication will be delivered via Next Day Courier to the confirmed prescription address in [facility].    The patient will receive a drug information handout for each medication shipped and additional FDA Medication Guides as required.  Verified that patient has previously received a Conservation officer, historic buildings and a Surveyor, mining.    The patient or caregiver noted above participated in the development of this care plan and knows that they can request review of or adjustments to the care plan at any time. All of the patient's questions and concerns have been addressed.    Kermit Balo, [facility]  [facility] Delivery [facility]  Pharmacist

## 2023-03-12 ENCOUNTER — Ambulatory Visit
Admission: RE | Admit: 2023-03-12 | Discharge: 2023-03-12 | Disposition: A | Discharge status: Home / Self Care | Payer: Medicaid Other | Source: Ambulatory Visit | Attending: Family Medicine | Admitting: Family Medicine

## 2023-03-12 DIAGNOSIS — Z1231 Encounter for screening mammogram for malignant neoplasm of breast: Secondary | ICD-10-CM | POA: Insufficient documentation

## 2023-03-27 NOTE — Unmapped (Signed)
[facility] Specialty and Home Delivery Pharmacy Refill Coordination Note    Kathleen Galvan, DOB: March 23, 1971  Phone: [phone] (home) [phone] (work)      All above HIPAA information was verified with patient.         03/26/2023     5:31 PM   Specialty Rx Medication Refill Questionnaire   Which Medications would you like refilled and shipped? Tagrisso   Please list all current allergies: Codeine, Latex   Have you missed any doses in the last 30 days? No   Have you had any changes to your medication(s) since your last refill? No   How many days remaining of each medication do you have at home? 13   Have you experienced any side effects in the last 30 days? No   Please enter the full address (street address, city, state, zip code) where you would like your medication(s) to be delivered to. [address] , [place], [place]   [address]   Please specify on which day you would like your medication(s) to arrive. Note: if you need your medication(s) within 3 days, please call the pharmacy to schedule your order at [phone]  04/06/2023   Has your insurance changed since your last refill? No   Would you like a pharmacist to call you to discuss your medication(s)? No   Do you require a signature for your package? (Note: if we are billing Medicare Part B or your order contains a controlled substance, we will require a signature) No         Completed refill call assessment today to schedule patient's medication shipment from the [facility] [phone]).  All relevant notes have been reviewed.       Confirmed patient received a Conservation officer, historic buildings and a Surveyor, mining with first shipment. The patient will receive a drug information handout for each medication shipped and additional FDA Medication Guides as required.         REFERRAL TO PHARMACIST     Referral to the pharmacist: Not needed      [facility]     Shipping address confirmed in Epic.     Delivery Scheduled: Yes, Expected medication delivery date: 04/06/23.     Medication will be delivered via Next Day Courier to the prescription address in Epic [place].    Kathleen Galvan [name]   [facility] Special[facility] and Home Delivery Pharmacy Specialty Technician

## 2023-04-05 MED FILL — TAGRISSO 80 MG TABLET: ORAL | 30 days supply | Qty: 30 | Fill #1

## 2023-04-24 NOTE — Unmapped (Signed)
[facility] Specialty and Home Delivery Pharmacy Refill Coordination Note    Kathleen Galvan, DOB: [birth year]  Phone: [phone] (home) [phone] (work)      All above HIPAA information was verified with patient.         04/23/2023     1:49 PM   Specialty Rx Medication Refill Questionnaire   Which Medications would you like refilled and shipped? Tagrisso. I have 15 left.   Please list all current allergies: Codeine and latex   Have you missed any doses in the last 30 days? No   Have you had any changes to your medication(s) since your last refill? No   How many days remaining of each medication do you have at home? 15 days   Have you experienced any side effects in the last 30 days? No   Please enter the full address (street address, city, state, zip code) where you would like your medication(s) to be delivered to. [address] , [place], [place] [address]   Please specify on which day you would like your medication(s) to arrive. Note: if you need your medication(s) within 3 days, please call the pharmacy to schedule your order at [phone]  05/08/2023   Has your insurance changed since your last refill? No   Would you like a pharmacist to call you to discuss your medication(s)? No   Do you require a signature for your package? (Note: if we are billing Medicare Part B or your order contains a controlled substance, we will require a signature) No         Completed refill call assessment today to schedule patient's medication shipment from the [facility] [phone]).  All relevant notes have been reviewed.       Confirmed patient received a Conservation officer, historic buildings and a Surveyor, mining with first shipment. The patient will receive a drug information handout for each medication shipped and additional FDA Medication Guides as required.         REFERRAL TO PHARMACIST     Referral to the pharmacist: Not needed      [facility]     Shipping address confirmed in Epic.     Delivery Scheduled: Yes, Expected medication delivery date: 05/08/23.     Medication will be delivered via Next Day Courier to the prescription address in Epic [place].    Kathleen Galvan   [facility] Specialty and Home Delivery Pharmacy Specialty Technician

## 2023-05-07 MED FILL — TAGRISSO 80 MG TABLET: ORAL | 30 days supply | Qty: 30 | Fill #2

## 2023-05-24 ENCOUNTER — Ambulatory Visit
Admit: 2023-05-24 | Discharge: 2023-05-24 | Payer: PRIVATE HEALTH INSURANCE | Attending: Internal Medicine | Primary: Internal Medicine

## 2023-05-24 ENCOUNTER — Ambulatory Visit: Admit: 2023-05-24 | Discharge: 2023-05-24 | Payer: PRIVATE HEALTH INSURANCE

## 2023-05-24 DIAGNOSIS — C7800 Secondary malignant neoplasm of unspecified lung: Principal | ICD-10-CM

## 2023-05-24 LAB — COMPREHENSIVE METABOLIC PANEL
ALBUMIN: 3.7 g/dL (ref 3.4–5.0)
ALKALINE PHOSPHATASE: 70 U/L (ref 46–116)
ALT (SGPT): 7 U/L — ABNORMAL LOW (ref 10–49)
ANION GAP: 9 mmol/L (ref 5–14)
AST (SGOT): 24 U/L (ref <=34)
BILIRUBIN TOTAL: 0.3 mg/dL (ref 0.3–1.2)
BLOOD UREA NITROGEN: 14 mg/dL (ref 9–23)
BUN / CREAT RATIO: 16
CALCIUM: 9.4 mg/dL (ref 8.7–10.4)
CHLORIDE: 109 mmol/L — ABNORMAL HIGH (ref 98–107)
CO2: 26.9 mmol/L (ref 20.0–31.0)
CREATININE: 0.89 mg/dL (ref 0.55–1.02)
EGFR CKD-EPI (2021) FEMALE: 78 mL/min/{1.73_m2} (ref >=60)
GLUCOSE RANDOM: 98 mg/dL (ref 70–179)
POTASSIUM: 3.9 mmol/L (ref 3.4–4.8)
PROTEIN TOTAL: 6.6 g/dL (ref 5.7–8.2)
SODIUM: 145 mmol/L (ref 135–145)

## 2023-05-24 LAB — CBC W/ AUTO DIFF
BASOPHILS ABSOLUTE COUNT: 0 10*9/L (ref 0.0–0.1)
BASOPHILS RELATIVE PERCENT: 1.4 %
EOSINOPHILS ABSOLUTE COUNT: 0.2 10*9/L (ref 0.0–0.5)
EOSINOPHILS RELATIVE PERCENT: 7.4 %
HEMATOCRIT: 32.6 % — ABNORMAL LOW (ref 34.0–44.0)
HEMOGLOBIN: 11 g/dL — ABNORMAL LOW (ref 11.3–14.9)
LYMPHOCYTES ABSOLUTE COUNT: 1.1 10*9/L (ref 1.1–3.6)
LYMPHOCYTES RELATIVE PERCENT: 42.8 %
MEAN CORPUSCULAR HEMOGLOBIN CONC: 33.8 g/dL (ref 32.0–36.0)
MEAN CORPUSCULAR HEMOGLOBIN: 31.5 pg (ref 25.9–32.4)
MEAN CORPUSCULAR VOLUME: 93.1 fL (ref 77.6–95.7)
MEAN PLATELET VOLUME: 6.9 fL (ref 6.8–10.7)
MONOCYTES ABSOLUTE COUNT: 0.4 10*9/L (ref 0.3–0.8)
MONOCYTES RELATIVE PERCENT: 15.7 %
NEUTROPHILS ABSOLUTE COUNT: 0.8 10*9/L — ABNORMAL LOW (ref 1.8–7.8)
NEUTROPHILS RELATIVE PERCENT: 32.7 %
NUCLEATED RED BLOOD CELLS: 0 /100{WBCs} (ref <=4)
PLATELET COUNT: 201 10*9/L (ref 150–450)
RED BLOOD CELL COUNT: 3.5 10*12/L — ABNORMAL LOW (ref 3.95–5.13)
RED CELL DISTRIBUTION WIDTH: 15.4 % — ABNORMAL HIGH (ref 12.2–15.2)
WBC ADJUSTED: 2.5 10*9/L — ABNORMAL LOW (ref 3.6–11.2)

## 2023-05-24 MED ADMIN — gadopiclenol (ELUCIREM,VUEWAY) injection 7 mL: 7 mL | INTRAVENOUS | @ 14:00:00 | Stop: 2023-05-24

## 2023-05-24 NOTE — Unmapped (Signed)
Thoracic Medical Oncology Clinic    Reason for visit: Evaluation and treatment recommendations for non-small-cell lung cancer    History of Present Illness: Kathleen Galvan is a 52 y.o. year old female with a past medical and oncologic history per below who presents to clinic for evaluation and treatment recommendations for lung adenocarcinoma. Doing great, no new issues currently. Sometime with discomfort in her feet after pedicures.     Review of Systems: All systems have been reviewed, pertinent positives and negative per history of present illness, otherwise deemed unremarkable.    Oncologic History: (Stage IVa: [id number], lung adenocarcinoma, PD-L1 0%, EGFR del19 mutation subtype)  - Experiencing progressive left-sided pain for the past year, as well as productive cough  - 12/23/2021 CT chest: 2.4 cm solid spiculated nodule in the apical LUL, left pleural nodules, 1.7 cm left fissural nodule, multiple additional subcentimeter fissural nodules involving the left major fissure, 0.9 cm right fissural nodule, prevascular lymphadenopathy 3.4 cm, bulkly level 5/6 adenopathy  - PET/CT 01/27/2022: FDG avid spiculated left apical nodule 2.4 cm, multiple additional minimally FDG avid solid perifissural pulmonary nodules in the left lung, subpleural nodularity avid. Intensely FDG avid anterior mediastinal lymph node measuring up to 3.1 cm  - Brain MRI 01/27/2022: no significant abnormalities  - MTOP discussion, pleural and mediastinal sampling was recommended  - 02/16/22 Left VATS: left pleura and station 5, lung adenocarcinoma  - 03/14/22: Started Osimertinib 80mg  every day  - 05/11/22: CT Chest showing very nice partial response  - 08/11/22: CT Chest stable  - 02/01/23: CT Chest stable  - 05/24/23: CT Chest stable    Allergies: Reviewed    Medications: Reviewed    Past Medical History:  Lung Cancer  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 Also has second hand smoke exposure with her husband.    Family History: Reviewed, non-contributory    Physical Examination:  Vitals: BP 118/80  - Pulse 86  - Temp 36.9 ??C (98.4 ??F) (Oral)  - Resp 16  - Ht 175.3 cm (5' 9)  - Wt 76.9 kg (169 lb 9.6 oz)  - SpO2 100%  - BMI 25.05 kg/m??   Performance Status: 1  General: alert and oriented, lucid, no distress and interactive  HEENT: EOMI, no scleral icterus, no oral lesions  Neck: no cervical or supraclavicular lymphadenopathy appreciated, supple  Cardiac: regular rate and rhythm, S1 S2 appreciated, no murmurs or gallops  Pulmonary: no dullness to percussion, good air movement, no crackles or wheezes  Abdomen: soft, non-tender, non-distended, normal bowel sounds, no organomegally  Extremities: no pedal edema, 2+ pulses throughout  Skin: clean, dry and intact, no rash    Imaging Studies: Reviewed per the Radiology interpretation, as well as my own personal assessment. Stable disease    CT Chest Wo Contrast  Result Date: 05/24/2023  Stable treated nodule in the left upper lobe with no findings of recurrent or new metastatic disease.     MRI Brain W Wo Contrast  Result Date: 05/24/2023  No intracranial metastatic disease.     Pertinent Laboratory: Reviewed    Assessment: [name] is a pleasant 52 y.o. 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 She has an excellent PS and  no concerning co-morbidities.     Plan:  1. 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 Ronnette Juniper, [place], MD in clinic.     Victorio Palm, MD PhD  Hematology / Oncology Fellow

## 2023-05-28 NOTE — Unmapped (Signed)
[facility] Specialty and Home Delivery Pharmacy Refill Coordination Note    Kathleen Galvan, DOB: 07/29/1970  Phone: [phone] (home) [phone] (work)      All above HIPAA information was verified with patient.         05/28/2023     9:47 AM   Specialty Rx Medication Refill Questionnaire   Which Medications would you like refilled and shipped? Tagrisso   Please list all current allergies: Codeine and Latex   Have you missed any doses in the last 30 days? No   Have you had any changes to your medication(s) since your last refill? No   How many days remaining of each medication do you have at home? 10   Have you experienced any side effects in the last 30 days? No   Please enter the full address (street address, city, state, zip code) where you would like your medication(s) to be delivered to. [address] , [place], [place] [address]   Please specify on which day you would like your medication(s) to arrive. Note: if you need your medication(s) within 3 days, please call the pharmacy to schedule your order at [phone]  06/04/2023   Has your insurance changed since your last refill? No   Would you like a pharmacist to call you to discuss your medication(s)? No   Do you require a signature for your package? (Note: if we are billing Medicare Part B or your order contains a controlled substance, we will require a signature) No         Completed refill call assessment today to schedule patient's medication shipment from the [facility] and Home Delivery Pharm[facility] [phone]).  All relevant notes have been reviewed.       Confirmed patient received a Conservation officer, historic buildings and a Surveyor, mining with first shipment. The patient will receive a drug information handout for each medication shipped and additional FDA Medication Guides as required.         REFERRAL TO PHARMACIST     Referral to the pharmacist: Not needed      [facility]     Shipping address confirmed in Epic.     Delivery Scheduled: Yes, Expected medication delivery date: 06/04/23.     Medication will be delivered via Same Day Courier to the prescription address in Epic [place].    Kathleen Galvan   [facility] Specialty and Home Delivery Pharmacy Specialty Technician

## 2023-06-04 MED FILL — TAGRISSO 80 MG TABLET: ORAL | 30 days supply | Qty: 30 | Fill #3

## 2023-06-26 NOTE — Unmapped (Signed)
[facility] Specialty and Home Delivery Pharmacy Refill Coordination Note    Kathleen Galvan, DOB: 10-24-1970  Phone: [phone] (home) [phone] (work)      All above HIPAA information was verified with patient.         06/26/2023    12:18 PM   Specialty Rx Medication Refill Questionnaire   Which Medications would you like refilled and shipped? Tagrisso   Please list all current allergies: Codeine and Latex   Have you missed any doses in the last 30 days? Yes   If Yes, how many doses have you missed ? 0-2   Have you had any changes to your medication(s) since your last refill? No   How many days remaining of each medication do you have at home? 13   Have you experienced any side effects in the last 30 days? No   Please enter the full address (street address, city, state, zip code) where you would like your medication(s) to be delivered to. [address] , [place] [address]   Please specify on which day you would like your medication(s) to arrive. Note: if you need your medication(s) within 3 days, please call the pharmacy to schedule your order at [phone]  07/06/2023   Has your insurance changed since your last refill? No   Would you like a pharmacist to call you to discuss your medication(s)? No   Do you require a signature for your package? (Note: if we are billing Medicare Part B or your order contains a controlled substance, we will require a signature) No         Completed refill call assessment today to schedule patient's medication shipment from the [facility] Special[facility] and Home Delivery Pharmacy [phone]).  All relevant notes have been reviewed.       Confirmed patient received a Conservation officer, historic buildings and a Surveyor, mining with first shipment. The patient will receive a drug information handout for each medication shipped and additional FDA Medication Guides as required.         REFERRAL TO PHARMACIST     Referral to the pharmacist: Not needed      [facility]     Shipping address confirmed in Epic. Delivery Scheduled: Yes, Expected medication delivery date: 07/06/23.     Medication will be delivered via Next Day Courier to the prescription address in Epic [place].    Kathleen Galvan [name]   [facility] Specialty and Home Delivery Pharmacy Specialty Technician

## 2023-07-05 MED FILL — TAGRISSO 80 MG TABLET: ORAL | 30 days supply | Qty: 30 | Fill #4

## 2023-08-03 NOTE — Unmapped (Signed)
 [facility] Specialty and Home Delivery Pharmacy Clinical Assessment & Refill Coordination Note    Kathleen Galvan, DOB: 04/08/1971  Phone: [phone] (home) [phone] (work)    All above HIPAA information was verified with patient.     Was a Nurse, learning disability used for this call? No    Specialty Medication(s):   Hematology/Oncology: Tagrisso     Current Outpatient Medications   Medication Sig Dispense Refill    ibuprofen (MOTRIN) 400 MG tablet Take 1 tablet (400 mg total) by mouth every six (6) hours as needed for pain.      ondansetron (ZOFRAN) 4 MG tablet Take 1 tablet (4 mg total) by mouth every eight (8) hours as needed for nausea. 30 tablet 1    osimertinib (TAGRISSO) 80 mg tablet Take 1 tablet (80 mg total) by mouth daily. 30 tablet 5     No current facility-administered medications for this visit.        Changes to medications: Kathleen Galvan reports no changes at this time.    Medication list has been reviewed and updated in Epic: Yes    Allergies   Allergen Reactions    Codeine Hcl Nausea Only    Latex, Natural Rubber Itching and Rash       Changes to allergies: No    Allergies have been reviewed and updated in Epic: Yes    SPECIALTY MEDICATION ADHERENCE     Tagrisso 80 mg: 5 days of medicine on hand     Medication Adherence    Patient reported X missed doses in the last month: 0  Specialty Medication: Tagrisso 80 mg once daily  Patient is on additional specialty medications: No  Informant: patient  Confirmed plan for next specialty medication refill: delivery by pharmacy  Refills needed for supportive medications: not needed          Are there any concerns with adherence? No    Adherence counseling provided? Not needed    Patient-Reported Symptoms Tracker for Cancer Patients on Oral Chemotherapy     Oral chemotherapy medication name(s): Tagrisso  Dose and frequency: 80 mg once daily  Oral Chemotherapy Start Date:    Baseline? No  Clinic(s) visited: Thoracic    Symptom Grouping Question Patient Response   Digestion and Eating Have you felt sick to your stomach? Denies    Had diarrhea? Denies    Constipated? Denies    Not wanting to eat? Denies    Comments      Sleep and Pain Felt very tired even after you rest? Denies    Pain due to cancer medication or cancer? Denies    Comments     Other Side Effects Numbness or tingling in hands and/or feet? Denies    Felt short of breath? Denies    Mouth or throat Sores? Denies    Rash? Denies    Palmar-plantar erythrodysesthesia syndrome?      Rash - acneiform?      Rash - maculo-papular?      How many days over the past month did your cancer medication or cancer keep you from your normal activities?  Write in number of days, 0-30:  0    Other side effects or things you would like to discuss? Nails/nail bed changes: cuticles are raw.  Discussed that is normal side effect of Tagrisso.  DIscussed the following: Keep hands/nails clean and dry.  Use non fragranced/no alcohol moisturizers.  Dont cut cuticles, don't wear fake nails.  Ok to get mani/pedi but do not cut cuticles.  Use kitchen/food prep gloves if cutting acidic foods or washing dishes.   Notify provider if gets worse    Comments?     Adherence  In the last 30 days, on how many days did you miss at least one dose of any of your [drug name]? Write in number of days, 0-30:  0    What reasons are you having trouble taking your medication [pharmacist: check all that apply]? Specify chemotherapy cycle:        No problems identified    Comments:        Comments       Optional Symptom Tracking Comments:      CLINICAL MANAGEMENT AND INTERVENTION      Clinical Benefit Assessment:    Do you feel the medicine is effective or helping your condition? Yes    Clinical Benefit counseling provided? Not needed    Acute Infection Status:    Acute infections noted within Epic:  No active infections    Patient reported infection: None    Therapy Appropriateness:    Is therapy appropriate based on current medication list, adverse reactions, adherence, clinical benefit and progress toward achieving therapeutic goals?  Yes, therapy is appropriate and should be continued    DISEASE/MEDICATION-SPECIFIC INFORMATION      N/A    Is the patient receiving adequate infection prevention treatment? Not applicable    Does the patient have adequate nutritional support? Not applicable    PATIENT SPECIFIC NEEDS     Does the patient have any physical, cognitive, or cultural barriers? No    Is the patient high risk? No    Did the patient require a clinical intervention? No    Does the patient require physician intervention or other additional services (i.e., nutrition, smoking cessation, social work)? No    Does the patient have an additional or emergency contact listed in their chart? Yes    SOCIAL DETERMINANTS OF HEALTH     At the [facility], we have learned that life circumstances - like trouble affording food, housing, utilities, or transportation can affect the health of many of our patients.   That is why we wanted to ask: are you currently experiencing any life circumstances that are negatively impacting your health and/or quality of life? Patient declined to answer    Social Drivers of Health     Food Insecurity: Not on file   Internet Connectivity: Not on file   Housing/Utilities: Not on file   Tobacco Use: Medium Risk (08/31/2022)    Patient History     Smoking Tobacco Use: Former     Smokeless Tobacco Use: Unknown     Passive Exposure: Not on file   Transportation Needs: Not on file   Alcohol Use: Not on file   Interpersonal Safety: Not on file   Physical Activity: Not on file   Intimate Partner Violence: Not on file   Stress: Not on file   Substance Use: Not on file (04/15/2023)   Social Connections: Not on file   Financial Resource Strain: Not on file   Depression: Not on file   Health Literacy: Not on file       Would you be willing to receive help with any of the needs that you have identified today? Not applicable       SHIPPING     Specialty Medication(s) to be Shipped:   Hematology/Oncology: Tagrisso    Other medication(s) to be shipped: No additional medications requested for fill at  this time     Changes to insurance: No    Delivery Scheduled: Yes, Expected medication delivery date: 08/06/23.     Medication will be delivered via Same Day Courier to the confirmed prescription address in [facility].    The patient will receive a drug information handout for each medication shipped and additional FDA Medication Guides as required.  Verified that patient has previously received a Conservation officer, historic buildings and a Surveyor, mining.    The patient or caregiver noted above participated in the development of this care plan and knows that they can request review of or adjustments to the care plan at any time.      All of the patient's questions and concerns have been addressed.    Kermit Balo, [facility]   [facility] Specialty and Home Delivery Pharmacy  Pharmacist

## 2023-08-06 DIAGNOSIS — C7800 Secondary malignant neoplasm of unspecified lung: Principal | ICD-10-CM

## 2023-08-06 MED FILL — TAGRISSO 80 MG TABLET: ORAL | 30 days supply | Qty: 30 | Fill #5

## 2023-08-06 NOTE — Unmapped (Signed)
 CBC orders sent to [facility] at [phone] she will go there this week to have drawn.

## 2023-08-22 DIAGNOSIS — C7800 Secondary malignant neoplasm of unspecified lung: Principal | ICD-10-CM

## 2023-08-22 MED ORDER — TAGRISSO 80 MG TABLET
ORAL_TABLET | Freq: Every day | ORAL | 5 refills | 30.00 days
Start: 2023-08-22 — End: 2024-02-18

## 2023-08-23 MED ORDER — OSIMERTINIB 80 MG TABLET
ORAL_TABLET | Freq: Every day | ORAL | 5 refills | 30.00 days | Status: CP
Start: 2023-08-23 — End: 2024-02-19
  Filled 2023-09-03: qty 30, 30d supply, fill #0

## 2023-08-29 NOTE — Unmapped (Signed)
 [facility] Specialty and Home Delivery Pharmacy Refill Coordination Note    Kathleen Galvan, DOB: 10/05/1970  Phone: [phone] (home) [phone] (work)      All above HIPAA information was verified with patient.         08/28/2023     6:48 PM   Specialty Rx Medication Refill Questionnaire   Which Medications would you like refilled and shipped? Tagrisso   Please list all current allergies: Codeine and Latex   Have you missed any doses in the last 30 days? No   Have you had any changes to your medication(s) since your last refill? No   How many days remaining of each medication do you have at home? 9   Have you experienced any side effects in the last 30 days? Yes   Please list the medication side effects below. (A pharmacist will reach out to you shortly to discuss your side effects. Note: your medications will not be shipped until we reach out to you). Sore nails and toes. Rash on both arms.   Please enter the full address (street address, city, state, zip code) where you would like your medication(s) to be delivered to. [address], [place], [place] [address]   Please specify on which day you would like your medication(s) to arrive. Note: if you need your medication(s) within 3 days, please call the pharmacy to schedule your order at [phone]  09/03/2023   Has your insurance changed since your last refill? No   Would you like a pharmacist to call you to discuss your medication(s)? No   Do you require a signature for your package? (Note: if we are billing Medicare Part B or your order contains a controlled substance, we will require a signature) No   I have been provided my out of pocket cost for my medication and approve the pharmacy to charge the amount to my credit card on file. Yes         Completed refill call assessment today to schedule patient's medication shipment from the [facility] and Home Delivery Pharmacy [phone]).  All relevant notes have been reviewed.       Confirmed patient received a Conservation officer, historic buildings and a Surveyor, mining with first shipment. The patient will receive a drug information handout for each medication shipped and additional FDA Medication Guides as required.         REFERRAL TO PHARMACIST     Referral to the pharmacist: Not needed      [facility]     Shipping address confirmed in Epic.     Delivery Scheduled: Yes, Expected medication delivery date: 09/03/23.     Medication will be delivered via Same Day Courier to the prescription address in Epic [place].    Kathleen Galvan [name]   [facility] Specialty and Home Delivery Pharmacy Specialty Technician

## 2023-09-25 NOTE — Unmapped (Signed)
 [facility] Specialty and Home Delivery Pharmacy Refill Coordination Note    Kathleen Galvan, DOB: 07-03-1970  Phone: [phone] (home) [phone] (work)      All above HIPAA information was verified with patient.         09/24/2023     6:20 PM   Specialty Rx Medication Refill Questionnaire   Which Medications would you like refilled and shipped? Tagrisso    Please list all current allergies: Codeine/Latex   Have you missed any doses in the last 30 days? No   Have you had any changes to your medication(s) since your last refill? No   How many days remaining of each medication do you have at home? 12   Have you experienced any side effects in the last 30 days? No   Please enter the full address (street address, city, state, zip code) where you would like your medication(s) to be delivered to. [address], [place], [place] [address]   Please specify on which day you would like your medication(s) to arrive. Note: if you need your medication(s) within 3 days, please call the pharmacy to schedule your order at [phone]  10/04/2023   Has your insurance changed since your last refill? No   Would you like a pharmacist to call you to discuss your medication(s)? No   Do you require a signature for your package? (Note: if we are billing Medicare Part B or your order contains a controlled substance, we will require a signature) No   I have been provided my out of pocket cost for my medication and approve the pharmacy to charge the amount to my credit card on file. Yes         Completed refill call assessment today to schedule patient's medication shipment from the [facility] and Home Delivery Pharmacy [phone]).  All relevant notes have been reviewed.       Confirmed patient received a Conservation officer, historic buildings and a Surveyor, mining with first shipment. The patient will receive a drug information handout for each medication shipped and additional FDA Medication Guides as required.         REFERRAL TO PHARMACIST Referral to the pharmacist: Not needed      [facility]     Shipping address confirmed in Epic.     Delivery Scheduled: Yes, Expected medication delivery date: 10/04/23.     Medication will be delivered via Next Day Courier to the prescription address in Epic [place].    Kathleen Galvan   [place] Specialty and Home Delivery Pharmacy Specialty Technician

## 2023-09-27 ENCOUNTER — Ambulatory Visit: Admit: 2023-09-27 | Discharge: 2023-09-27 | Payer: Medicaid (Managed Care)

## 2023-09-27 ENCOUNTER — Inpatient Hospital Stay: Admit: 2023-09-27 | Discharge: 2023-09-27 | Payer: Medicaid (Managed Care)

## 2023-09-27 ENCOUNTER — Ambulatory Visit
Admit: 2023-09-27 | Discharge: 2023-09-27 | Payer: Medicaid (Managed Care) | Attending: Internal Medicine | Primary: Internal Medicine

## 2023-09-27 DIAGNOSIS — C7800 Secondary malignant neoplasm of unspecified lung: Principal | ICD-10-CM

## 2023-09-27 LAB — CBC W/ AUTO DIFF
BASOPHILS ABSOLUTE COUNT: 0 10*9/L (ref 0.0–0.1)
BASOPHILS RELATIVE PERCENT: 1.6 %
EOSINOPHILS ABSOLUTE COUNT: 0.2 10*9/L (ref 0.0–0.5)
EOSINOPHILS RELATIVE PERCENT: 6.4 %
HEMATOCRIT: 33 % — ABNORMAL LOW (ref 34.0–44.0)
HEMOGLOBIN: 11.3 g/dL (ref 11.3–14.9)
LYMPHOCYTES ABSOLUTE COUNT: 1 10*9/L — ABNORMAL LOW (ref 1.1–3.6)
LYMPHOCYTES RELATIVE PERCENT: 42.5 %
MEAN CORPUSCULAR HEMOGLOBIN CONC: 34.1 g/dL (ref 32.0–36.0)
MEAN CORPUSCULAR HEMOGLOBIN: 30.8 pg (ref 25.9–32.4)
MEAN CORPUSCULAR VOLUME: 90.4 fL (ref 77.6–95.7)
MEAN PLATELET VOLUME: 6.8 fL (ref 6.8–10.7)
MONOCYTES ABSOLUTE COUNT: 0.3 10*9/L (ref 0.3–0.8)
MONOCYTES RELATIVE PERCENT: 13.6 %
NEUTROPHILS ABSOLUTE COUNT: 0.9 10*9/L — ABNORMAL LOW (ref 1.8–7.8)
NEUTROPHILS RELATIVE PERCENT: 35.9 %
NUCLEATED RED BLOOD CELLS: 0 /100{WBCs} (ref <=4)
PLATELET COUNT: 208 10*9/L (ref 150–450)
RED BLOOD CELL COUNT: 3.66 10*12/L — ABNORMAL LOW (ref 3.95–5.13)
RED CELL DISTRIBUTION WIDTH: 16.7 % — ABNORMAL HIGH (ref 12.2–15.2)
WBC ADJUSTED: 2.4 10*9/L — ABNORMAL LOW (ref 3.6–11.2)

## 2023-09-27 LAB — COMPREHENSIVE METABOLIC PANEL
ALBUMIN: 4 g/dL (ref 3.4–5.0)
ALKALINE PHOSPHATASE: 77 U/L (ref 46–116)
ALT (SGPT): <7 U/L — ABNORMAL LOW (ref 10–49)
ANION GAP: 10 mmol/L (ref 5–14)
AST (SGOT): 24 U/L (ref <=34)
BILIRUBIN TOTAL: 0.3 mg/dL (ref 0.3–1.2)
BLOOD UREA NITROGEN: 15 mg/dL (ref 9–23)
BUN / CREAT RATIO: 17
CALCIUM: 9.6 mg/dL (ref 8.7–10.4)
CHLORIDE: 108 mmol/L — ABNORMAL HIGH (ref 98–107)
CO2: 26.9 mmol/L (ref 20.0–31.0)
CREATININE: 0.88 mg/dL (ref 0.55–1.02)
EGFR CKD-EPI (2021) FEMALE: 79 mL/min/1.73m2 (ref >=60)
GLUCOSE RANDOM: 90 mg/dL (ref 70–179)
POTASSIUM: 4 mmol/L (ref 3.4–4.8)
PROTEIN TOTAL: 7 g/dL (ref 5.7–8.2)
SODIUM: 145 mmol/L (ref 135–145)

## 2023-09-27 NOTE — Unmapped (Signed)
 Thoracic Medical Oncology Clinic    Reason for visit: Evaluation and treatment recommendations for non-small-cell lung cancer    History of Present Illness: Kathleen Galvan is a 53 y.o. year old female with a past medical and oncologic history per below who presents to clinic for evaluation and treatment recommendations for lung adenocarcinoma. Doing great, no new issues currently. Sometime with discomfort in her feet after pedicures.     Review of Systems: All systems have been reviewed, pertinent positives and negative per history of present illness, otherwise deemed unremarkable.    Oncologic History: (Stage IVa: [id number], lung adenocarcinoma, PD-L1 0%, EGFR del19 mutation subtype)  - Experiencing progressive left-sided pain for the past year, as well as productive cough  - 12/23/2021 CT chest: 2.4 cm solid spiculated nodule in the apical LUL, left pleural nodules, 1.7 cm left fissural nodule, multiple additional subcentimeter fissural nodules involving the left major fissure, 0.9 cm right fissural nodule, prevascular lymphadenopathy 3.4 cm, bulkly level 5/6 adenopathy  - PET/CT 01/27/2022: FDG avid spiculated left apical nodule 2.4 cm, multiple additional minimally FDG avid solid perifissural pulmonary nodules in the left lung, subpleural nodularity avid. Intensely FDG avid anterior mediastinal lymph node measuring up to 3.1 cm  - Brain MRI 01/27/2022: no significant abnormalities  - MTOP discussion, pleural and mediastinal sampling was recommended  - 02/16/22 Left VATS: left pleura and station 5, lung adenocarcinoma  - 03/14/22: Started Osimertinib  80mg  every day  - 05/11/22: CT Chest showing very nice partial response  - 08/11/22: CT Chest stable  - 02/01/23: CT Chest stable  - 05/24/23: CT Chest stable; MRI brain wow without evidence of disease  - 09/27/23: CT Chest with stable disease    Allergies: Reviewed    Medications: Reviewed    Past Medical History:  Lung Cancer  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 Also has second hand smoke exposure with her husband.    Family History: Reviewed, non-contributory    Physical Examination:  Vitals: BP 144/67  - Pulse 75  - Temp 36.3 ??C (97.4 ??F) (Temporal)  - Resp 16  - Ht 175.3 cm (5' 9)  - Wt 70.8 kg (156 lb)  - SpO2 100%  - BMI 23.04 kg/m??   Performance Status: 1  General: alert and oriented, lucid, no distress and interactive  HEENT: EOMI, no scleral icterus, no oral lesions  Neck: no cervical or supraclavicular lymphadenopathy appreciated, supple  Cardiac: regular rate and rhythm, S1 S2 appreciated, no murmurs or gallops  Pulmonary: no dullness to percussion, good air movement, no crackles or wheezes  Abdomen: soft, non-tender, non-distended, normal bowel sounds, no organomegally  Extremities: no pedal edema, 2+ pulses throughout  Skin: clean, dry and intact, no rash    Imaging Studies: Reviewed per the Radiology interpretation, as well as my own personal assessment. Stable disease    CT Chest Wo Contrast  Result Date: 09/27/2023  No evidence of new or recurrent thoracic malignancy. Unchanged treated left upper lobe pulmonary nodule.       Pertinent Laboratory: Reviewed    Assessment: Kathleen Galvan is a pleasant 52 y.o. 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 She has an excellent PS and no concerning co-morbidities.  Plan:  1. 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, PD-L1 0%):  - Continue Osimertinib  80mg  PO qDaily  - RTC in 4 months with labs and for CT CAP and MRI Brain to evaluate response to therapy    Orders Placed This Encounter   Procedures    CT Abdomen Pelvis W Contrast     Standing Status:   Future     Expected Date:   01/27/2024     Expiration Date:   09/26/2024     Is the patient pregnant?:   No     Does the patient have any history of allergic reaction during injection of IV contrast?:   No     Reason for Exam::   lung cancer survelliance     Performed at:   [facility]     Protocol::   Routine    CT Chest W Contrast     Standing Status:   Future     Expected Date:   01/27/2024     Expiration Date:   09/26/2024     Is the patient pregnant?:   No     What is the patient's sedation requirement?:   No Sedation     Does the patient have any history of allergic reaction during injection of IV contrast?:   No     Performed at:   [facility]     Protocol::   Routine     Reason for Exam::   lung cancer survelliance    MRI Brain W Wo Contrast     Standing Status:   Future     Expected Date:   01/27/2024     Expiration Date:   09/26/2024     Is the patient pregnant?:   No     What is the patient's sedation requirement?:   No Sedation     Does the patient have metallic implants or external cardiac devices?:   No Implants     Does the patient have any history of allergic reaction during injection of IV contrast?:   No     Performed at:   [facility]     Protocol::   Routine     Reason for Exam::   lung cancer surveillance    CBC w/ Differential     Standing Status:   Future     Expected Date:   01/27/2024     Expiration Date:   09/26/2024     Release to patient:   Immediate [1]    Comprehensive Metabolic Panel     Standing Status:   Future     Expected Date:   01/27/2024     Expiration Date:   09/26/2024     Is this a fasting order?:   No         Patient seen and discussed with Dr. Pecot, [place] Victor, MD in clinic.     Marin Shutters, MD PhD  Hematology / Oncology Fellow

## 2023-10-03 MED FILL — TAGRISSO 80 MG TABLET: ORAL | 30 days supply | Qty: 30 | Fill #1

## 2023-10-24 NOTE — Unmapped (Signed)
 [facility] Specialty and Home Delivery Pharmacy Refill Coordination Note    Kathleen Galvan, DOB: 1971/02/04  Phone: [phone] (home) [phone] (work)      All above HIPAA information was verified with patient.         10/24/2023    12:04 PM   Specialty Rx Medication Refill Questionnaire   Which Medications would you like refilled and shipped? Tagrisso    Please list all current allergies: Codeine and Latex   Have you missed any doses in the last 30 days? No   Have you had any changes to your medication(s) since your last refill? No   How many days remaining of each medication do you have at home? 12   Have you experienced any side effects in the last 30 days? No   Please enter the full address (street address, city, state, zip code) where you would like your medication(s) to be delivered to. [address], [place] [address]   Please specify on which day you would like your medication(s) to arrive. Note: if you need your medication(s) within 3 days, please call the pharmacy to schedule your order at [phone]  11/05/2023   Has your insurance changed since your last refill? No   Would you like a pharmacist to call you to discuss your medication(s)? No   Do you require a signature for your package? (Note: if we are billing Medicare Part B or your order contains a controlled substance, we will require a signature) No   I have been provided my out of pocket cost for my medication and approve the pharmacy to charge the amount to my credit card on file. Yes         Completed refill call assessment today to schedule patient's medication shipment from the [facility] and Home Delivery Pharmacy [phone]).  All relevant notes have been reviewed.       Confirmed patient received a Conservation officer, historic buildings and a Surveyor, mining with first shipment. The patient will receive a drug information handout for each medication shipped and additional FDA Medication Guides as required.         REFERRAL TO PHARMACIST Referral to the pharmacist: Not needed      [facility]     Shipping address confirmed in Epic.     Delivery Scheduled: Yes, Expected medication delivery date: 11/05/23.     Medication will be delivered via Same Day Courier to the prescription address in Epic [place].    Kathleen Galvan   [place] Specialty and Home Delivery Pharmacy Specialty Technician

## 2023-11-05 MED FILL — TAGRISSO 80 MG TABLET: ORAL | 30 days supply | Qty: 30 | Fill #2

## 2023-11-27 NOTE — Unmapped (Signed)
 [facility] Specialty and Home Delivery Pharmacy Refill Coordination Note    Kathleen Galvan, DOB: [birth year]  Phone: [phone] (home) [phone] (work)      All above HIPAA information was verified with patient.         11/27/2023     3:33 PM   Specialty Rx Medication Refill Questionnaire   Which Medications would you like refilled and shipped? Tagrisso    Please list all current allergies: Codeine Latex   Have you missed any doses in the last 30 days? No   Have you had any changes to your medication(s) since your last refill? No   How many days remaining of each medication do you have at home? 8   Have you experienced any side effects in the last 30 days? No   Please enter the full address (street address, city, state, zip code) where you would like your medication(s) to be delivered to. [address], [place] [address]   Please specify on which day you would like your medication(s) to arrive. Note: if you need your medication(s) within 3 days, please call the pharmacy to schedule your order at [phone]  12/04/2023   Has your insurance changed since your last refill? No   Would you like a pharmacist to call you to discuss your medication(s)? No   Do you require a signature for your package? (Note: if we are billing Medicare Part B or your order contains a controlled substance, we will require a signature) No   I have been provided my out of pocket cost for my medication and approve the pharmacy to charge the amount to my credit card on file. Yes         Completed refill call assessment today to schedule patient's medication shipment from the [facility] and Home Delivery Pharmacy [phone]).  All relevant notes have been reviewed.       Confirmed patient received a Conservation officer, historic buildings and a Surveyor, mining with first shipment. The patient will receive a drug information handout for each medication shipped and additional FDA Medication Guides as required.         REFERRAL TO PHARMACIST     Referral to the pharmacist: Not needed      [facility]     Shipping address confirmed in Epic.     Delivery Scheduled: Yes, Expected medication delivery date: 12/04/23.     Medication will be delivered via Next Day Courier to the prescription address in Epic [place].    Taler Kushner M Santer Torres   [place] Special[facility] and Home Delivery Pharmacy Specialty Technician

## 2023-12-03 MED FILL — TAGRISSO 80 MG TABLET: ORAL | 30 days supply | Qty: 30 | Fill #3

## 2023-12-24 NOTE — Unmapped (Addendum)
 12/24/2023 - Clinical assessment date was due on 01/04/2024. Reached out to JB and got approval to proceed with scheduling the patients refill and to move the clinical assessment date to match the next refill coordination.    [facility] Specialty and Home Delivery Pharmacy Refill Coordination Note    Kathleen Galvan, DOB: January 16, 1971  Phone: [phone] (home) [phone] (work)      All above HIPAA information was verified with patient.         12/22/2023     2:33 PM   Specialty Rx Medication Refill Questionnaire   Which Medications would you like refilled and shipped? Tagrisso    Please list all current allergies: Codeine  & Latex   Have you missed any doses in the last 30 days? No   Have you had any changes to your medication(s) since your last refill? No   How much of each medication do you have remaining at home? (eg. number of tablets, injections, etc.) 13   Have you experienced any side effects in the last 30 days? No   Please enter the full address (street address, city, state, zip code) where you would like your medication(s) to be delivered to. [address] , [place] [address]   Please specify on which day you would like your medication(s) to arrive. Note: if you need your medication(s) within 3 days, please call the pharmacy to schedule your order at [phone]  01/03/2024   Has your insurance changed since your last refill? No   Would you like a pharmacist to call you to discuss your medication(s)? No   Do you require a signature for your package? (Note: if we are billing Medicare Part B or your order contains a controlled substance, we will require a signature) No   I have been provided my out of pocket cost for my medication and approve the pharmacy to charge the amount to my credit card on file. Yes         Completed refill call assessment today to schedule patient's medication shipment from the [facility] and Home Delivery Pharmacy [phone]).  All relevant notes have been reviewed. Confirmed patient received a Conservation officer, historic buildings and a Surveyor, mining with first shipment. The patient will receive a drug information handout for each medication shipped and additional FDA Medication Guides as required.         REFERRAL TO PHARMACIST     Referral to the pharmacist: Not needed      [facility]     Shipping address confirmed in Epic.     Delivery Scheduled: Yes, Expected medication delivery date: 01/03/2024.     Medication will be delivered via Next Day Courier to the prescription address in Epic [place].    Kathleen Galvan   [facility] Specialty and Home Delivery Pharmacy Specialty Technician

## 2024-01-02 MED FILL — TAGRISSO 80 MG TABLET: ORAL | 30 days supply | Qty: 30 | Fill #4

## 2024-01-24 ENCOUNTER — Inpatient Hospital Stay: Admit: 2024-01-24 | Discharge: 2024-01-24 | Payer: Medicaid (Managed Care)

## 2024-01-24 ENCOUNTER — Encounter
Admit: 2024-01-24 | Discharge: 2024-01-24 | Payer: Medicaid (Managed Care) | Attending: Internal Medicine | Primary: Internal Medicine

## 2024-01-24 ENCOUNTER — Ambulatory Visit: Admit: 2024-01-24 | Discharge: 2024-01-24 | Payer: Medicaid (Managed Care)

## 2024-01-24 ENCOUNTER — Ambulatory Visit
Admit: 2024-01-24 | Discharge: 2024-01-24 | Payer: Medicaid (Managed Care) | Attending: Internal Medicine | Primary: Internal Medicine

## 2024-01-24 DIAGNOSIS — C7800 Secondary malignant neoplasm of unspecified lung: Principal | ICD-10-CM

## 2024-01-24 LAB — COMPREHENSIVE METABOLIC PANEL
ALBUMIN: 3.8 g/dL (ref 3.4–5.0)
ALKALINE PHOSPHATASE: 62 U/L (ref 46–116)
ALT (SGPT): 7 U/L — ABNORMAL LOW (ref 10–49)
ANION GAP: 10 mmol/L (ref 5–14)
AST (SGOT): 22 U/L (ref <=34)
BILIRUBIN TOTAL: 0.5 mg/dL (ref 0.3–1.2)
BLOOD UREA NITROGEN: 14 mg/dL (ref 9–23)
BUN / CREAT RATIO: 16
CALCIUM: 9.4 mg/dL (ref 8.7–10.4)
CHLORIDE: 107 mmol/L (ref 98–107)
CO2: 26.2 mmol/L (ref 20.0–31.0)
CREATININE: 0.87 mg/dL (ref 0.55–1.02)
EGFR CKD-EPI (2021) FEMALE: 80 mL/min/1.73m2 (ref >=60)
GLUCOSE RANDOM: 86 mg/dL (ref 70–179)
POTASSIUM: 4.1 mmol/L (ref 3.4–4.8)
PROTEIN TOTAL: 6.5 g/dL (ref 5.7–8.2)
SODIUM: 143 mmol/L (ref 135–145)

## 2024-01-24 LAB — CBC W/ AUTO DIFF
BASOPHILS ABSOLUTE COUNT: 0 10*9/L (ref 0.0–0.1)
BASOPHILS RELATIVE PERCENT: 1.2 %
EOSINOPHILS ABSOLUTE COUNT: 0.1 10*9/L (ref 0.0–0.5)
EOSINOPHILS RELATIVE PERCENT: 3.9 %
HEMATOCRIT: 31.1 % — ABNORMAL LOW (ref 34.0–44.0)
HEMOGLOBIN: 10.8 g/dL — ABNORMAL LOW (ref 11.3–14.9)
LYMPHOCYTES ABSOLUTE COUNT: 0.8 10*9/L — ABNORMAL LOW (ref 1.1–3.6)
LYMPHOCYTES RELATIVE PERCENT: 30 %
MEAN CORPUSCULAR HEMOGLOBIN CONC: 34.8 g/dL (ref 32.0–36.0)
MEAN CORPUSCULAR HEMOGLOBIN: 31.6 pg (ref 25.9–32.4)
MEAN CORPUSCULAR VOLUME: 90.7 fL (ref 77.6–95.7)
MEAN PLATELET VOLUME: 7 fL (ref 6.8–10.7)
MONOCYTES ABSOLUTE COUNT: 0.4 10*9/L (ref 0.3–0.8)
MONOCYTES RELATIVE PERCENT: 15.6 %
NEUTROPHILS ABSOLUTE COUNT: 1.4 10*9/L — ABNORMAL LOW (ref 1.8–7.8)
NEUTROPHILS RELATIVE PERCENT: 49.3 %
NUCLEATED RED BLOOD CELLS: 0 /100{WBCs} (ref <=4)
PLATELET COUNT: 208 10*9/L (ref 150–450)
RED BLOOD CELL COUNT: 3.43 10*12/L — ABNORMAL LOW (ref 3.95–5.13)
RED CELL DISTRIBUTION WIDTH: 16.2 % — ABNORMAL HIGH (ref 12.2–15.2)
WBC ADJUSTED: 2.8 10*9/L — ABNORMAL LOW (ref 3.6–11.2)

## 2024-01-24 MED ADMIN — gadopiclenol (ELUCIREM,VUEWAY) injection 7 mL: 7 mL | INTRAVENOUS | @ 13:00:00 | Stop: 2024-01-24

## 2024-01-24 MED ADMIN — iohexol (OMNIPAQUE) 350 mg iodine/mL solution 100 mL: 100 mL | INTRAVENOUS | @ 14:00:00 | Stop: 2024-01-24

## 2024-01-24 NOTE — Unmapped (Signed)
 [facility] Specialty and Home Delivery Pharmacy Clinical Assessment & Refill Coordination Note    Kathleen Galvan, DOB: Jul 13, 1970  Phone: [phone] (home) [phone] (work)    All above HIPAA information was verified with patient.     Was a Nurse, learning disability used for this call? No    Specialty Medication(s):   Hematology/Oncology: Tagrisso      Current Medications[1]     Changes to medications: Darci reports no changes at this time.    Medication list has been reviewed and updated in Epic: Yes    Allergies[2]    Changes to allergies: No    Allergies have been reviewed and updated in Epic: Yes    SPECIALTY MEDICATION ADHERENCE     Tagrisso  80 mg: 11 days of medicine on hand     Medication Adherence    Patient reported X missed doses in the last month: 0  Specialty Medication: Tagrisso  80 mg once daily  Patient is on additional specialty medications: No  Informant: patient  Confirmed plan for next specialty medication refill: delivery by pharmacy  Refills needed for supportive medications: not needed          Are there any concerns with adherence? No    Adherence counseling provided? Not needed    Patient-Reported Symptoms Tracker for Cancer Patients on Oral Chemotherapy     Oral chemotherapy medication name(s): Tagrisso   Dose and frequency: 80 mg once daily  Oral Chemotherapy Start Date:    Baseline? No  Clinic(s) visited: Thoracic  Were you able to reach the patient on a call today?      Symptom Grouping Question Patient Response   Digestion and Eating Have you felt sick to your stomach? Denies    Had diarrhea? Denies    Constipated? Denies    Not wanting to eat? Denies    Comments      Sleep and Pain Felt very tired even after you rest? Denies    Pain due to cancer medication or cancer? Denies    Comments     Other Side Effects Numbness or tingling in hands and/or feet? Denies    Felt short of breath? Denies    Mouth or throat Sores? Denies    Rash? Denies    Palmar-plantar erythrodysesthesia syndrome?      Rash - acneiform?      Rash - maculo-papular?      How many days over the past month did your cancer medication or cancer keep you from your normal activities?  Write in number of days, 0-30:  0    Other side effects or things you would like to discuss?      Comments?     Adherence  In the last 30 days, on how many days did you miss at least one dose of any of your [drug name]? Write in number of days, 0-30:  0    What reasons are you having trouble taking your medication [pharmacist: check all that apply]? Specify chemotherapy cycle:        No problems identified    Comments:        Comments       Optional Symptom Tracking  New start - hematology ([facility]    Comments:      Specialty Pharmacist Interventions:     Question Patient Response    What actions did you take in response to the patient's PRO answers? No action required   Other (Specify):  reviewed med and allergy list  CLINICAL MANAGEMENT AND INTERVENTION      Clinical Benefit Assessment:    Do you feel the medicine is effective or helping your condition? Yes    Clinical Benefit counseling provided? Not needed    Acute Infection Status:    Acute infections noted within Epic:  No active infections    Patient reported infection: None    Therapy Appropriateness:    Is therapy appropriate based on current medication list, adverse reactions, adherence, clinical benefit and progress toward achieving therapeutic goals?  Yes, therapy is appropriate and should be continued    DISEASE/MEDICATION-SPECIFIC INFORMATION      N/A    Is the patient receiving adequate infection prevention treatment? Not applicable    Does the patient have adequate nutritional support? Not applicable    PATIENT SPECIFIC NEEDS     Does the patient have any physical, cognitive, or cultural barriers? No    Is the patient high risk? Yes, patient is taking oral chemotherapy. Appropriateness of therapy as been assessed    Did the patient require a clinical intervention? No    Does the patient require physician intervention or other additional services (i.e., nutrition, smoking cessation, social work)? No    Does the patient have an additional or emergency contact listed in their chart? Yes    SOCIAL DETERMINANTS OF HEALTH     At the [facility], we have learned that life circumstances - like trouble affording food, housing, utilities, or transportation can affect the health of many of our patients.   That is why we wanted to ask: are you currently experiencing any life circumstances that are negatively impacting your health and/or quality of life? Patient declined to answer    Social Drivers of Health     Food Insecurity: Not on file   Tobacco Use: Medium Risk (03/12/2023)    Received from [facility]    Patient History     Smoking Tobacco Use: Former     Smokeless Tobacco Use: Never     Passive Exposure: Not on file   Transportation Needs: Not on file   Alcohol Use: Not on file   Housing: Not on file   Physical Activity: Not on file   Utilities: Not on file   Stress: Not on file   Interpersonal Safety: Not on file   Substance Use: Not on file (04/15/2023)   Intimate Partner Violence: Not on file   Social Connections: Not on file   Financial Resource Strain: Not on file   Health Literacy: Not on file   Internet Connectivity: Not on file       Would you be willing to receive help with any of the needs that you have identified today? Not applicable       SHIPPING     Specialty Medication(s) to be Shipped:   Hematology/Oncology: Tagrisso     Other medication(s) to be shipped: No additional medications requested for fill at this time    Specialty Medications not needed at this time: N/A     Changes to insurance: No    Cost and Payment: Patient has a $0 copay, payment information is not required.    Delivery Scheduled: Yes, Expected medication delivery date: 01/31/24.     Medication will be delivered via Next Day Courier to the confirmed prescription address in [facility].    The patient will receive a drug information handout for each medication shipped and additional FDA Medication Guides as required.  Verified that patient has previously received  a Conservation officer, historic buildings and a Surveyor, mining.    The patient or caregiver noted above participated in the development of this care plan and knows that they can request review of or adjustments to the care plan at any time.      All of the patient's questions and concerns have been addressed.    Aneita DELENA Merck, [facility]   [place] [facility] Delivery [facility]  Pharmacist       [1]   Current Outpatient Medications   Medication Sig Dispense Refill    ibuprofen (MOTRIN) 400 MG tablet Take 1 tablet (400 mg total) by mouth every six (6) hours as needed for pain.      ondansetron  (ZOFRAN ) 4 MG tablet Take 1 tablet (4 mg total) by mouth every eight (8) hours as needed for nausea. 30 tablet 1    osimertinib  (TAGRISSO ) 80 mg tablet Take 1 tablet (80 mg total) by mouth daily. 30 tablet 5     No current facility-administered medications for this visit.   [2]   Allergies  Allergen Reactions    Codeine Hcl Nausea Only    Latex, Natural Rubber Itching and Rash

## 2024-01-24 NOTE — Unmapped (Signed)
 [facility]cology [facility]    Reason for visit: Evaluation and treatment recommendations for non-small-cell lung cancer    History of Present Illness: Ms. Kathleen Galvan is a 53 y.o. year old female with a past medical and oncologic history per below who presents to clinic for evaluation and treatment recommendations for lung adenocarcinoma. No new issues, doing great, enjoying her grandchildren.      Review of Systems: All systems have been reviewed, pertinent positives and negative per history of present illness, otherwise deemed unremarkable.    Oncologic History: (Stage IVa: [id number], lung adenocarcinoma, PD-L1 0%, EGFR del19 mutation subtype)  - Experiencing progressive left-sided pain for the past year, as well as productive cough  - 12/23/2021 CT chest: 2.4 cm solid spiculated nodule in the apical LUL, left pleural nodules, 1.7 cm left fissural nodule, multiple additional subcentimeter fissural nodules involving the left major fissure, 0.9 cm right fissural nodule, prevascular lymphadenopathy 3.4 cm, bulkly level 5/6 adenopathy  - PET/CT 01/27/2022: FDG avid spiculated left apical nodule 2.4 cm, multiple additional minimally FDG avid solid perifissural pulmonary nodules in the left lung, subpleural nodularity avid. Intensely FDG avid anterior mediastinal lymph node measuring up to 3.1 cm  - Brain MRI 01/27/2022: no significant abnormalities  - MTOP discussion, pleural and mediastinal sampling was recommended  - 02/16/22 Left VATS: left pleura and station 5, lung adenocarcinoma  - 03/14/22: Started Osimertinib  80mg  every day  - 05/11/22: CT Chest showing very nice partial response  - 08/11/22: CT Chest stable  - 02/01/23: CT Chest stable  - 05/24/23: CT Chest stable; MRI brain wow without evidence of disease  - 09/27/23: CT Chest with stable disease  - 01/24/24: Brain MRI and CT CAP stable disease    Allergies: Reviewed    Medications: Reviewed    Past Medical History:  Lung Cancer  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 Also has second hand smoke exposure with her husband.    Family History: Reviewed, non-contributory    Physical Examination:  Vitals: BP 115/61  - Pulse 89  - Temp 36.5 ??C (97.7 ??F) (Temporal)  - Ht 175.3 cm (5' 9)  - Wt 72.3 kg (159 lb 6.4 oz)  - SpO2 99%  - BMI 23.54 kg/m??   Performance Status: 1  General: alert and oriented, lucid, no distress and interactive  HEENT: EOMI, no scleral icterus, no oral lesions  Neck: no cervical or supraclavicular lymphadenopathy appreciated, supple  Cardiac: regular rate and rhythm, S1 S2 appreciated, no murmurs or gallops  Pulmonary: no dullness to percussion, good air movement, no crackles or wheezes  Abdomen: soft, non-tender, non-distended, normal bowel sounds, no organomegally  Extremities: no pedal edema, 2+ pulses throughout  Skin: clean, dry and intact, no rash    Imaging Studies: Reviewed per the Radiology interpretation, as well as my own personal assessment. Stable disease    CT Abdomen Pelvis W Contrast  Result Date: 01/24/2024  No areas concerning for new metastatic disease. Please see separately dictated same-day CT chest for further classification of findings above the diaphragm. Thickening of the left adrenal gland, 1.1 x 1.1 cm (2:43), appear similar based on previous imaging available when measured similarly. Other chronic or incidental findings as detailed in the report above.     CT Chest W Contrast  Result Date: 01/24/2024  Stable posttreatment changes in the left upper lobe. No evidence of recurrent or metastatic disease.     MRI Brain W Wo Contrast  Result Date: 01/24/2024  No evidence of intracranial metastatic disease.     Pertinent Laboratory: Reviewed    Assessment: Kathleen Galvan is a pleasant 53 y.o. 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 She has an excellent PS and no concerning co-morbidities.     Plan:  1. 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

## 2024-01-29 ENCOUNTER — Other Ambulatory Visit: Payer: Self-pay | Admitting: Family Medicine

## 2024-01-29 DIAGNOSIS — Z1231 Encounter for screening mammogram for malignant neoplasm of breast: Secondary | ICD-10-CM

## 2024-01-30 MED FILL — TAGRISSO 80 MG TABLET: ORAL | 30 days supply | Qty: 30 | Fill #5

## 2024-02-18 DIAGNOSIS — C7800 Secondary malignant neoplasm of unspecified lung: Principal | ICD-10-CM

## 2024-02-18 MED ORDER — OSIMERTINIB 80 MG TABLET
ORAL_TABLET | Freq: Every day | ORAL | 5 refills | 30.00000 days | Status: CP
Start: 2024-02-18 — End: 2024-08-16
  Filled 2024-03-03: qty 30, 30d supply, fill #0

## 2024-02-18 NOTE — Unmapped (Signed)
 [facility] Specialty and Home Delivery Pharmacy Refill Coordination Note    Kathleen Galvan, DOB: 02/22/1971  Phone: [phone] (home) [phone] (work)      All above HIPAA information was verified with patient.         02/18/2024     3:16 PM   Specialty Rx Medication Refill Questionnaire   Which Medications would you like refilled and shipped? Tagrisso  80mg    Please list all current allergies: Codeine and Latex   Have you missed any doses in the last 30 days? No   Have you had any changes to your medication(s) since your last refill? No   How much of each medication do you have remaining at home? (eg. number of tablets, injections, etc.) 16 pills   Have you experienced any side effects in the last 30 days? No   Please enter the full address (street address, city, state, zip code) where you would like your medication(s) to be delivered to. [address] , [place] [address]   Please specify on which day you would like your medication(s) to arrive. Note: if you need your medication(s) within 3 days, please call the pharmacy to schedule your order at [phone]  03/04/2024   Has your insurance changed since your last refill? No   Would you like a pharmacist to call you to discuss your medication(s)? No   Do you require a signature for your package? (Note: if we are billing Medicare Part B or your order contains a controlled substance, we will require a signature) No   I have been provided my out of pocket cost for my medication and approve the pharmacy to charge the amount to my credit card on file. Yes         Completed refill call assessment today to schedule patient's medication shipment from the [facility] and Home Delivery Pharmacy [phone]).  All relevant notes have been reviewed.       Confirmed patient received a Conservation officer, historic buildings and a Surveyor, mining with first shipment. The patient will receive a drug information handout for each medication shipped and additional FDA Medication Guides as required.         REFERRAL TO PHARMACIST     Referral to the pharmacist: Not needed      [facility]     Shipping address confirmed in Epic.     Delivery Scheduled: Yes, Expected medication delivery date: 03/04/24.     Medication will be delivered via Next Day Courier to the prescription address in Epic [place].    Kathleen Galvan   [place] Special[facility] and Home Delivery Pharmacy Specialty Technician

## 2024-03-13 ENCOUNTER — Ambulatory Visit
Admission: RE | Admit: 2024-03-13 | Discharge: 2024-03-13 | Disposition: A | Discharge status: Home / Self Care | Source: Ambulatory Visit | Attending: Family Medicine | Admitting: Family Medicine

## 2024-03-13 DIAGNOSIS — Z1231 Encounter for screening mammogram for malignant neoplasm of breast: Secondary | ICD-10-CM | POA: Diagnosis present

## 2024-03-25 NOTE — Unmapped (Signed)
 [facility] Specialty and Home Delivery Pharmacy Refill Coordination Note    Kathleen Galvan, DOB: July 08, 1970  Phone: [phone] (home) [phone] (work)      All above HIPAA information was verified with patient.         03/24/2024     7:37 PM   Specialty Rx Medication Refill Questionnaire   Which Medications would you like refilled and shipped? Tagrisso  80mg    Please list all current allergies: Codeine/ Latex   Have you missed any doses in the last 30 days? No   Have you had any changes to your medication(s) since your last refill? No   How much of each medication do you have remaining at home? (eg. number of tablets, injections, etc.) 11 pills   Have you experienced any side effects in the last 30 days? No   Please enter the full address (street address, city, state, zip code) where you would like your medication(s) to be delivered to. [address] , [place], [place] [address]   Please specify on which day you would like your medication(s) to arrive. Note: if you need your medication(s) within 3 days, please call the pharmacy to schedule your order at [phone]  03/31/2024   Has your insurance changed since your last refill? No   Would you like a pharmacist to call you to discuss your medication(s)? No   Do you require a signature for your package? (Note: if we are billing Medicare Part B or your order contains a controlled substance, we will require a signature) No   I have been provided my out of pocket cost for my medication and approve the pharmacy to charge the amount to my credit card on file. Yes         Completed refill call assessment today to schedule patient's medication shipment from the [facility] and Home Delivery Pharmacy [phone]).  All relevant notes have been reviewed.       Confirmed patient received a Conservation officer, historic buildings and a Surveyor, mining with first shipment. The patient will receive a drug information handout for each medication shipped and additional FDA Medication Guides as required.         REFERRAL TO PHARMACIST     Referral to the pharmacist: Not needed      [facility]     Shipping address confirmed in Epic.     Delivery Scheduled: Yes, Expected medication delivery date: 03/31/24.     Medication will be delivered via Same Day Courier to the prescription address in Epic [place].    Skylin Kennerson M Santer Torres   [place] Specialty and Home Delivery Pharmacy Specialty Technician

## 2024-03-31 MED FILL — TAGRISSO 80 MG TABLET: ORAL | 30 days supply | Qty: 30 | Fill #1

## 2024-04-22 NOTE — Progress Notes (Signed)
 [facility] and [facility] Delivery Pharm[facility] Refill Coordination Note    Kathleen Galvan, DOB: [birth year]  Phone: [phone] (home) [phone] (work)      All above HIPAA information was verified with patient.         04/22/2024    12:47 PM   Specialty Rx Medication Refill Questionnaire   Which Medications would you like refilled and shipped? Tagrisso  80mg    Please list all current allergies: Codeine/Latex   Have you missed any doses in the last 30 days? No   Have you had any changes to your medication(s) since your last refill? No   How much of each medication do you have remaining at home? (eg. number of tablets, injections, etc.) 12   Have you experienced any side effects in the last 30 days? No   Please enter the full address (street address, city, state, zip code) where you would like your medication(s) to be delivered to. [address] [place] [address]   Please specify on which day you would like your medication(s) to arrive. Note: if you need your medication(s) within 3 days, please call the pharmacy to schedule your order at [phone]  04/29/2024   Has your insurance changed since your last refill? No   Would you like a pharmacist to call you to discuss your medication(s)? No   Do you require a signature for your package? (Note: if we are billing Medicare Part B or your order contains a controlled substance, we will require a signature) No   I have been provided my out of pocket cost for my medication and approve the pharmacy to charge the amount to my credit card on file. Yes         Completed refill call assessment today to schedule patient's medication shipment from the [facility] and [facility] Delivery Pharmacy [phone]).  All relevant notes have been reviewed.       Confirmed patient received a Conservation Officer, Historic Buildings and a Surveyor, Mining with first shipment. The patient will receive a drug information handout for each medication shipped and additional FDA Medication Guides as required.         REFERRAL TO PHARMACIST     Referral to the pharmacist: Not needed      [facility]     Shipping address confirmed in Epic.     Delivery Scheduled: Yes, Expected medication delivery date: 04/29/24.     Medication will be delivered via Next Day Courier to the prescription address in Epic [place].    Jaymes Hang M Santer Torres   [facility] Specialty Technician

## 2024-04-28 MED FILL — TAGRISSO 80 MG TABLET: ORAL | 30 days supply | Qty: 30 | Fill #2

## 2024-05-22 NOTE — Progress Notes (Signed)
 [facility] Special[facility] and Home Delivery Pharmacy Refill Coordination Note    Kathleen Galvan, DOB: [birth year]  Phone: [phone] (home) [phone] (work)      All above HIPAA information was verified with patient.         05/22/2024    12:20 PM   Specialty Rx Medication Refill Questionnaire   Which Medications would you like refilled and shipped? Tagrisso  80mg    Please list all current allergies: Codeine/Latex   Have you missed any doses in the last 30 days? No   Have you had any changes to your medication(s) since your last refill? No   How much of each medication do you have remaining at home? (eg. number of tablets, injections, etc.) 12 pills   Have you experienced any side effects in the last 30 days? No   Please enter the full address (street address, city, state, zip code) where you would like your medication(s) to be delivered to. [address], [place]. [place] [address]   Please specify on which day you would like your medication(s) to arrive. Note: if you need your medication(s) within 3 days, please call the pharmacy to schedule your order at [phone]  06/03/2024   Has your insurance changed since your last refill? No   Would you like a pharmacist to call you to discuss your medication(s)? No   Do you require a signature for your package? (Note: if we are billing Medicare Part B or your order contains a controlled substance, we will require a signature) No   I have been provided my out of pocket cost for my medication and approve the pharmacy to charge the amount to my credit card on file. Yes         Completed refill call assessment today to schedule patient's medication shipment from the [facility] and Home Delivery Pharmacy [phone]).  All relevant notes have been reviewed.       Confirmed patient received a Conservation Officer, Historic Buildings and a Surveyor, Mining with first shipment. The patient will receive a drug information handout for each medication shipped and additional FDA Medication Guides as required.         REFERRAL TO PHARMACIST     Referral to the pharmacist: Not needed      [facility]     Shipping address confirmed in Epic.     Delivery Scheduled: Yes, Expected medication delivery date: 06/03/24.     Medication will be delivered via Next Day Courier to the prescription address in Epic [place].    Virgil Slinger   [facility] Special[facility] and Home Delivery Pharmacy Specialty Technician

## 2024-06-02 MED FILL — TAGRISSO 80 MG TABLET: ORAL | 30 days supply | Qty: 30 | Fill #3

## 2024-06-24 NOTE — Progress Notes (Signed)
 [facility] Specialty and Home Delivery Pharmacy Refill Coordination Note    Kathleen Galvan, DOB: 12/24/1970  Phone: [phone] (home) [phone] (work)      All above HIPAA information was verified with patient.         06/21/2024     2:12 PM   Specialty Rx Medication Refill Questionnaire   Which Medications would you like refilled and shipped? Tagrisso  80mg    Please list all current allergies: Codeine/ Latex   Have you missed any doses in the last 30 days? No   Have you had any changes to your medication(s) since your last refill? No   How much of each medication do you have remaining at home? (eg. number of tablets, injections, etc.) 12   Have you experienced any side effects in the last 30 days? No   Please enter the full address (street address, city, state, zip code) where you would like your medication(s) to be delivered to. [address] , [place] [address]   Please specify on which day you would like your medication(s) to arrive. Note: if you need your medication(s) within 3 days, please call the pharmacy to schedule your order at [phone]  07/01/2024   Has your insurance changed since your last refill? No   Would you like a pharmacist to call you to discuss your medication(s)? No   Do you require a signature for your package? (Note: if we are billing Medicare Part B or your order contains a controlled substance, we will require a signature) No   I have been provided my out of pocket cost for my medication and approve the pharmacy to charge the amount to my credit card on file. Yes         Completed refill call assessment today to schedule patient's medication shipment from the [facility] and Home Delivery Pharmacy [phone]).  All relevant notes have been reviewed.       Confirmed patient received a Conservation Officer, Historic Buildings and a Surveyor, Mining with first shipment. The patient will receive a drug information handout for each medication shipped and additional FDA Medication Guides as required.         REFERRAL TO PHARMACIST     Referral to the pharmacist: Not needed      [facility]     Shipping address confirmed in Epic.     Delivery Scheduled: Yes, Expected medication delivery date: 07/01/2024.     Medication will be delivered via Next Day Courier to the prescription address in Epic [place].    Kathleen Galvan   [facility] Specialty and Home Delivery Pharmacy Specialty Technician

## 2024-07-01 MED FILL — TAGRISSO 80 MG TABLET: ORAL | 30 days supply | Qty: 30 | Fill #4
